# Patient Record
Sex: Female | Born: 1995 | Race: Black or African American | Hispanic: No | Marital: Single | Smoking: Former smoker
Health system: Southern US, Community
[De-identification: ages and names within clinical notes are randomized; demographics above are authoritative.]

---

## 2017-02-27 ENCOUNTER — Encounter (HOSPITAL_COMMUNITY): Payer: Self-pay

## 2017-02-27 ENCOUNTER — Emergency Department (HOSPITAL_COMMUNITY)
Admission: EM | Admit: 2017-02-27 | Discharge: 2017-02-27 | Disposition: A | Discharge status: Home / Self Care | Payer: Self-pay | Attending: Emergency Medicine | Admitting: Emergency Medicine

## 2017-02-27 DIAGNOSIS — J111 Influenza due to unidentified influenza virus with other respiratory manifestations: Secondary | ICD-10-CM | POA: Insufficient documentation

## 2017-02-27 DIAGNOSIS — R69 Illness, unspecified: Secondary | ICD-10-CM

## 2017-02-27 MED ORDER — KETOROLAC TROMETHAMINE 60 MG/2ML IM SOLN
60.0000 mg | Freq: Once | INTRAMUSCULAR | Status: AC
  Administered 2017-02-27: 60 mg via INTRAMUSCULAR
  Filled 2017-02-27: qty 2

## 2017-02-27 NOTE — ED Triage Notes (Signed)
Patient complains of cold symptoms, fever, body aches and headache since last Wednesday. On arrival alert and oriented, NAD

## 2017-02-27 NOTE — ED Notes (Signed)
Pt presents with HA and generalized pain.

## 2017-02-27 NOTE — ED Provider Notes (Signed)
[facility]EMER[facility]ENCY DEPT Provider Note   CSN: [id number] Arrival date & time: 02/27/17  1813  By signing my name below, I, Phyllis Petersen, attest that this documentation has been prepared under the direction and in the presence of Phyllis BuccoMelanie Yurani Fettes, MD. Electronically Signed: Doreatha MartinEva Petersen, ED Scribe. 02/27/17. 7:41 PM.     History   Chief Complaint No chief complaint on file.   HPI Phyllis Petersen is a 21 y.o. female who presents to the Emergency Department complaining of moderate generalized body aches onset 4 days ago with associated cough, rhinorrhea, congestion, subjective fever, nausea, vomiting. Pt also complains of a 10/10, constant, throbbing posterior HA that is unrelieved with Tylenol. She denies recent fall or head injury. No h/o migraines. Pt states she is tolerating fluids without difficulty but vomits after eating. No worsening factors noted. She denies diarrhea, neck pain, back pain. LMP now  The history is provided by the patient. No language interpreter was used.    History reviewed. No pertinent past medical history.  There are no active problems to display for this patient.   History reviewed. No pertinent surgical history.  OB History    No data available       Home Medications    Prior to Admission medications   Not on File    Family History No family history on file.  Social History Social History  Substance Use Topics  . Smoking status: Never Smoker  . Smokeless tobacco: Never Used  . Alcohol use Not on file     Allergies   Patient has no known allergies.   Review of Systems Review of Systems  Constitutional: Positive for fever (subjective).  HENT: Positive for congestion and rhinorrhea.   Eyes: Negative for photophobia.  Respiratory: Positive for cough.   Gastrointestinal: Positive for nausea and vomiting. Negative for diarrhea.  Musculoskeletal: Positive for myalgias (generalized). Negative for back pain and neck pain.  Skin: Negative for  wound.  Neurological: Positive for headaches. Negative for weakness and numbness.  All other systems reviewed and are negative.    Physical Exam Updated Vital Signs BP 129/72 (BP Location: Right Arm)   Pulse 108   Temp 99.9 F (37.7 C) (Oral)   Resp 20   SpO2 100%   Physical Exam  Constitutional: She is oriented to person, place, and time. She appears well-developed and well-nourished.  HENT:  Head: Normocephalic and atraumatic.  Right Ear: External ear normal.  Left Ear: External ear normal.  Mouth/Throat: Oropharynx is clear and moist.  TMs normal bilaterally.   Eyes: Pupils are equal, round, and reactive to light.  No photophobia  Neck: Normal range of motion. Neck supple.  No meningismus.   Cardiovascular: Normal rate, regular rhythm and normal heart sounds.   Pulmonary/Chest: Effort normal and breath sounds normal. No respiratory distress. She has no wheezes. She has no rales. She exhibits no tenderness.  Abdominal: Soft. Bowel sounds are normal. There is no tenderness. There is no rebound and no guarding.  Musculoskeletal: Normal range of motion. She exhibits no edema.  Lymphadenopathy:    She has no cervical adenopathy.  Neurological: She is alert and oriented to person, place, and time.  Skin: Skin is warm and dry. No rash noted.  Psychiatric: She has a normal mood and affect.  Nursing note and vitals reviewed.    ED Treatments / Results   DIAGNOSTIC STUDIES: Oxygen Saturation is 100% on RA, normal by my interpretation.    COORDINATION OF CARE: 7:39 PM Discussed treatment  plan with pt at bedside which includes symptomatic therapy and pt agreed to plan.    Labs (all labs ordered are listed, but only abnormal results are displayed) Labs Reviewed - No data to display  EKG  EKG Interpretation None       Radiology No results found.  Procedures Procedures (including critical care time)  Medications Ordered in ED Medications  ketorolac (TORADOL)  injection 60 mg (60 mg Intramuscular Given 02/27/17 2024)     Initial Impression / Assessment and Plan / ED Course  I have reviewed the triage vital signs and the nursing notes.  Pertinent labs & imaging results that were available during my care of the patient were reviewed by me and considered in my medical decision making (see chart for details).     Patient is a well-appearing 21 year old with flulike symptoms. She has a headache associated with the symptoms but no meningismus or other suggestions of meningitis or subarachnoid hemorrhage. She is neurologically intact. She is nontoxic-appearing. She was discharged home in good condition. She was given dose of Toradol in the ED. Symptomatic care instructions were given. She is out of the window for Tamiflu.Return precautions were given.  Final Clinical Impressions(s) / ED Diagnoses   Final diagnoses:  Influenza-like illness    New Prescriptions New Prescriptions   No medications on file    I personally performed the services described in this documentation, which was scribed in my presence.  The recorded information has been reviewed and considered.    Phyllis Bucco, MD 02/27/17 2046

## 2017-03-06 ENCOUNTER — Emergency Department (HOSPITAL_COMMUNITY)
Admission: EM | Admit: 2017-03-06 | Discharge: 2017-03-06 | Disposition: A | Discharge status: Home / Self Care | Payer: Self-pay | Attending: Emergency Medicine | Admitting: Emergency Medicine

## 2017-03-06 ENCOUNTER — Encounter (HOSPITAL_COMMUNITY): Payer: Self-pay

## 2017-03-06 DIAGNOSIS — B309 Viral conjunctivitis, unspecified: Secondary | ICD-10-CM | POA: Insufficient documentation

## 2017-03-06 MED ORDER — FLUORESCEIN SODIUM 0.6 MG OP STRP
2.0000 | ORAL_STRIP | Freq: Once | OPHTHALMIC | Status: AC
  Administered 2017-03-06: 2 via OPHTHALMIC
  Filled 2017-03-06: qty 2

## 2017-03-06 MED ORDER — TETRACAINE HCL 0.5 % OP SOLN
2.0000 [drp] | Freq: Once | OPHTHALMIC | Status: AC
  Administered 2017-03-06: 2 [drp] via OPHTHALMIC
  Filled 2017-03-06: qty 2

## 2017-03-06 NOTE — ED Notes (Signed)
Declined W/C at D/C and was escorted to lobby by RN. 

## 2017-03-06 NOTE — ED Triage Notes (Signed)
Patient complains of bilateral eye redness x 4 days. Denies trauma, denies drainage. Bilateral eye burning when exposed to light. No blurred vision

## 2017-03-06 NOTE — ED Provider Notes (Signed)
[facility] Provider Note   CSN: [id number] Arrival date & time: 03/06/17  1443     History   Chief Complaint Chief Complaint  Patient presents with  . eye redness    HPI Phyllis Petersen is a 21 y.o. female.  Phyllis Petersen is a 21 y.o. female who presents to the ER with a chief complaint of eye redness.  It started 4 days ago, has been constant, is worsened with light, better with nothing and has been associated with feelings of dry scratchy eyes, and headache.  She reports last week she had "the flu" but reports no flu like symptoms today.  She denies any trauma to her eyes, says she has no discharge or drainage bilaterally. No history of anything similar.  Reports no contact or glasses use.  She has blurry vision bilaterally, and reports the light makes her eyes and head hurt.  She does not feel like her eyes are stuck shut when she wakes up in the morning.  No rashes on her face. She reports no other symptoms.  No fevers, ear pain, nausea, vomiting, dizziness, stiff neck, abd pain.  She has decreased appetite since Monday, before ocular symptoms started but said she has not eaten normal since she was diagnosed with the flu.      History reviewed. No pertinent past medical history.  There are no active problems to display for this patient.   History reviewed. No pertinent surgical history.  OB History    No data available       Home Medications    Prior to Admission medications   Not on File    Family History No family history on file.  Social History Social History  Substance Use Topics  . Smoking status: Never Smoker  . Smokeless tobacco: Never Used  . Alcohol use Not on file     Allergies   Patient has no known allergies.   Review of Systems Review of Systems  Constitutional: Positive for appetite change. Negative for activity change, fatigue and fever.  HENT: Negative for congestion, dental problem, ear pain, facial swelling, sneezing,  sore throat, tinnitus and trouble swallowing.   Eyes: Positive for photophobia, pain, redness, itching and visual disturbance. Negative for discharge.  Respiratory: Negative for cough and shortness of breath.   Cardiovascular: Negative for chest pain.  Gastrointestinal: Negative for abdominal pain, diarrhea, nausea and vomiting.  Skin: Negative for rash and wound.  Neurological: Positive for headaches. Negative for dizziness, facial asymmetry, speech difficulty, weakness and light-headedness.  Hematological: Negative for adenopathy.     Physical Exam Updated Vital Signs BP 117/66 (BP Location: Left Arm)   Pulse 92   Temp 98.3 F (36.8 C) (Oral)   Resp 20   SpO2 100%   Physical Exam  Constitutional: She is oriented to person, place, and time. She appears well-developed and well-nourished. No distress.  HENT:  Head: Normocephalic and atraumatic.  Eyes: EOM are normal. Pupils are equal, round, and reactive to light. Right eye exhibits chemosis. Right eye exhibits no discharge, no exudate and no hordeolum. No foreign body present in the right eye. Left eye exhibits chemosis. Left eye exhibits no discharge, no exudate and no hordeolum. No foreign body present in the left eye. Right conjunctiva is injected. Right conjunctiva has no hemorrhage. Left conjunctiva is injected. Left conjunctiva has no hemorrhage. No scleral icterus.    Woods lamp exam performed.  No abrasions, or corneal defects noted.    Cardiovascular: Normal rate.  Pulmonary/Chest: Effort normal. No stridor.  Neurological: She is alert and oriented to person, place, and time. No cranial nerve deficit or sensory deficit. GCS eye subscore is 4. GCS verbal subscore is 5. GCS motor subscore is 6.  Skin: Skin is warm and dry. No rash noted. She is not diaphoretic.  Nursing note and vitals reviewed.    ED Treatments / Results  Labs (all labs ordered are listed, but only abnormal results are displayed) Labs Reviewed - No  data to display  EKG  EKG Interpretation None       Radiology No results found.  Procedures Procedures (including critical care time)  Medications Ordered in ED Medications  tetracaine (PONTOCAINE) 0.5 % ophthalmic solution 2 drop (2 drops Both Eyes Given 03/06/17 1647)  fluorescein ophthalmic strip 2 strip (2 strips Both Eyes Given 03/06/17 1647)     Initial Impression / Assessment and Plan / ED Course  I have reviewed the triage vital signs and the nursing notes.  Pertinent labs & imaging results that were available during my care of the patient were reviewed by me and considered in my medical decision making (see chart for details).    Viral conjunctivitis  Patient presentation consistent with viral conjunctivitis.  No purulent discharge, corneal abrasions, entrapment, consensual photophobia, or dendritic staining with fluorescein study.  Presentation non-concerning for iritis, bacterial conjunctivitis, corneal abrasions, or HSV.  No antibiotics are indicated and patient has been using Visine for itching.  Personal hygiene and frequent handwashing discussed.  Patient advised to followup with ophthalmologist if symptoms persist or worsen in any way including vision change or purulent discharge.  Patient verbalizes understanding and is agreeable with discharge.  I reviewed all results and findings with the patient and she is agreeable with the plan, diagnosis and discharge.  Final Clinical Impressions(s) / ED Diagnoses   Final diagnoses:  Viral conjunctivitis of both eyes    New Prescriptions New Prescriptions   No medications on file   Cristina GongElizabeth [name] PA-C 5:15 PM    Earnstine RegalElizabeth W [place], [place] 03/06/17 1716    Arby BarretteMarcy Pfeiffer, MD 03/06/17 [id number]

## 2017-09-23 ENCOUNTER — Encounter (HOSPITAL_COMMUNITY): Payer: Self-pay | Admitting: *Deleted

## 2017-09-23 ENCOUNTER — Emergency Department (HOSPITAL_COMMUNITY)
Admission: EM | Admit: 2017-09-23 | Discharge: 2017-09-24 | Disposition: A | Discharge status: Home / Self Care | Payer: Self-pay | Attending: Emergency Medicine | Admitting: Emergency Medicine

## 2017-09-23 DIAGNOSIS — R05 Cough: Secondary | ICD-10-CM

## 2017-09-23 DIAGNOSIS — J40 Bronchitis, not specified as acute or chronic: Secondary | ICD-10-CM

## 2017-09-23 DIAGNOSIS — R059 Cough, unspecified: Secondary | ICD-10-CM

## 2017-09-23 NOTE — ED Triage Notes (Signed)
The pt reports a cold and a cough for 3 days  No temp today she reports vlood tinged sputum

## 2017-09-24 ENCOUNTER — Emergency Department (HOSPITAL_COMMUNITY): Payer: Self-pay

## 2017-09-24 MED ORDER — ALBUTEROL SULFATE HFA 108 (90 BASE) MCG/ACT IN AERS
2.0000 | INHALATION_SPRAY | Freq: Once | RESPIRATORY_TRACT | Status: AC
Start: 2017-09-24 — End: 2017-09-24
  Administered 2017-09-24: 2 via RESPIRATORY_TRACT
  Filled 2017-09-24: qty 6.7

## 2017-09-24 MED ORDER — PREDNISONE 50 MG PO TABS: 50.0000 mg | ORAL_TABLET | Freq: Every day | ORAL | 0 refills | Status: DC

## 2017-09-24 MED ORDER — BENZONATATE 100 MG PO CAPS: 100.0000 mg | ORAL_CAPSULE | Freq: Three times a day (TID) | ORAL | 0 refills | Status: DC

## 2017-09-24 NOTE — Discharge Instructions (Signed)
Medications: Albuterol inhaler, Tessalon, prednisone  Treatment: Use albuterol inhaler every 4-6 hours as needed for shortness of breath, cough, wheezing, chest tightness. Take Tessalon every 8 hours as needed for cough. Take prednisone every day for the next 5 days.  Follow-up: Please return to the emergency department if you develop any shortness of breath, fever over 100.4, chest pain, increase in the amount of blood your coughing up, or any other new or concerning symptoms.

## 2017-09-24 NOTE — ED Provider Notes (Signed)
[facility] Provider Note   CSN: [id number] Arrival date & time: 09/23/17  2308     History   Chief Complaint Chief Complaint  Patient presents with  . Cough    HPI Phyllis Petersen is a 21 y.o. female Who was previously healthy who presents with a three-day history of cough and blood-tinged sputum. Patient has had associated chest tightness, worse with lying down at night. She reports she had a week of cough symptoms that turned into worsening symptoms 3 days ago. She denies any fevers. She also denies any shortness of breath. She has had some associated nausea with certain smells of food. Patient has taken Tylenol, Alka-Seltzer, NyQuil, DayQuil, Mucinex without relief of symptoms. She has had some associated sore throat, but no ear pain. She denies any history of blood clots, new leg pain or swelling, recent trips or surgeries, cancer, exogenous estrogen use.  HPI  History reviewed. No pertinent past medical history.  There are no active problems to display for this patient.   History reviewed. No pertinent surgical history.  OB History    No data available       Home Medications    Prior to Admission medications   Medication Sig Start Date End Date Taking? Authorizing Provider  benzonatate (TESSALON) 100 MG capsule Take 1 capsule (100 mg total) by mouth every 8 (eight) hours. 09/24/17   Gareld Obrecht, Waylan Boga, PA-C  predniSONE (DELTASONE) 50 MG tablet Take 1 tablet (50 mg total) by mouth daily with breakfast. 09/24/17   Emi Holes, PA-C    Family History No family history on file.  Social History Social History  Substance Use Topics  . Smoking status: Never Smoker  . Smokeless tobacco: Never Used  . Alcohol use No     Allergies   Patient has no known allergies.   Review of Systems Review of Systems  Constitutional: Negative for chills and fever.  HENT: Positive for congestion and sore throat. Negative for ear pain and facial swelling.     Respiratory: Positive for cough and chest tightness. Negative for shortness of breath.   Cardiovascular: Negative for chest pain.  Gastrointestinal: Positive for nausea. Negative for abdominal pain and vomiting.  Genitourinary: Negative for dysuria.  Musculoskeletal: Negative for back pain.  Skin: Negative for rash and wound.  Neurological: Negative for headaches.  Psychiatric/Behavioral: The patient is not nervous/anxious.      Physical Exam Updated Vital Signs BP 129/72 (BP Location: Left Arm)   Pulse 89   Temp 97.8 F (36.6 C) (Oral)   Resp 18   Ht  (1.549 m)   Wt 95.9 kg (211 lb 7 oz)   LMP 09/09/2017   SpO2 99%   BMI 39.95 kg/m   Physical Exam  Constitutional: She appears well-developed and well-nourished. No distress.  HENT:  Head: Normocephalic and atraumatic.  Mouth/Throat: Oropharynx is clear and moist. No oropharyngeal exudate, posterior oropharyngeal edema, posterior oropharyngeal erythema or tonsillar abscesses.  Eyes: Pupils are equal, round, and reactive to light. Conjunctivae are normal. Right eye exhibits no discharge. Left eye exhibits no discharge. No scleral icterus.  Neck: Normal range of motion. Neck supple. No thyromegaly present.  Cardiovascular: Normal rate, regular rhythm, normal heart sounds and intact distal pulses.  Exam reveals no gallop and no friction rub.   No murmur heard. Pulmonary/Chest: Effort normal and breath sounds normal. No stridor. No respiratory distress. She has no decreased breath sounds. She has no wheezes. She has no rales.  Abdominal:  Soft. Bowel sounds are normal. She exhibits no distension. There is no tenderness. There is no rebound and no guarding.  Musculoskeletal: She exhibits no edema.  Lymphadenopathy:    She has no cervical adenopathy.  Neurological: She is alert. Coordination normal.  Skin: Skin is warm and dry. No rash noted. She is not diaphoretic. No pallor.  Psychiatric: She has a normal mood and affect.   Nursing note and vitals reviewed.    ED Treatments / Results  Labs (all labs ordered are listed, but only abnormal results are displayed) Labs Reviewed - No data to display  EKG  EKG Interpretation None       Radiology Dg Chest 2 View  Result Date: 09/24/2017 CLINICAL DATA:  Cold symptoms for 1 week, now coughing up blood tinged sputum and chest tightness. EXAM: CHEST  2 VIEW COMPARISON:  None. FINDINGS: The cardiomediastinal contours are normal. The lungs are clear. Pulmonary vasculature is normal. No consolidation, pleural effusion, or pneumothorax. No acute osseous abnormalities are seen. IMPRESSION: No acute pulmonary process. Electronically Signed   By: Rubye Oaks M.D.   On: 09/24/2017 00:25    Procedures Procedures (including critical care time)  Medications Ordered in ED Medications  albuterol (PROVENTIL HFA;VENTOLIN HFA) 108 (90 Base) MCG/ACT inhaler 2 puff (not administered)     Initial Impression / Assessment and Plan / ED Course  I have reviewed the triage vital signs and the nursing notes.  Pertinent labs & imaging results that were available during my care of the patient were reviewed by me and considered in my medical decision making (see chart for details).     Pt symptoms consistent with URI with probable bronchitis. PERC negative. No chest pain or shortness of breath. CXR negative for acute infiltrate. Pt will be discharged with symptomatic treatment, Including albuterol inhaler, prednisone, Tessalon.  Discussed return precautions.  Patient understands and agrees with plan. Patient vitals stable throughout ED course and discharged in satisfactory condition. I discussed patient case with Dr. Blinda Leatherwood who guided the patient's management and agrees with plan.   Final Clinical Impressions(s) / ED Diagnoses   Final diagnoses:  Cough  Bronchitis    New Prescriptions New Prescriptions   BENZONATATE (TESSALON) 100 MG CAPSULE    Take 1 capsule (100 mg  total) by mouth every 8 (eight) hours.   PREDNISONE (DELTASONE) 50 MG TABLET    Take 1 tablet (50 mg total) by mouth daily with breakfast.     Emi Holes, PA-C 09/24/17 0037    Gilda Crease, MD 09/24/17 [phone]

## 2017-09-24 NOTE — ED Notes (Signed)
Patient Alert and oriented X4. Stable and ambulatory. Patient verbalized understanding of the discharge instructions.  Patient belongings were taken by the patient.  

## 2018-02-17 ENCOUNTER — Encounter (HOSPITAL_COMMUNITY): Payer: Self-pay | Admitting: Emergency Medicine

## 2018-02-17 ENCOUNTER — Other Ambulatory Visit: Payer: Self-pay

## 2018-02-17 ENCOUNTER — Emergency Department (HOSPITAL_COMMUNITY): Payer: Self-pay

## 2018-02-17 ENCOUNTER — Emergency Department (HOSPITAL_COMMUNITY)
Admission: EM | Admit: 2018-02-17 | Discharge: 2018-02-17 | Disposition: A | Discharge status: Home / Self Care | Payer: Self-pay | Attending: Emergency Medicine | Admitting: Emergency Medicine

## 2018-02-17 DIAGNOSIS — J111 Influenza due to unidentified influenza virus with other respiratory manifestations: Secondary | ICD-10-CM | POA: Insufficient documentation

## 2018-02-17 DIAGNOSIS — R69 Illness, unspecified: Secondary | ICD-10-CM

## 2018-02-17 DIAGNOSIS — Z79899 Other long term (current) drug therapy: Secondary | ICD-10-CM | POA: Insufficient documentation

## 2018-02-17 MED ORDER — BENZONATATE 100 MG PO CAPS: 100.0000 mg | ORAL_CAPSULE | Freq: Three times a day (TID) | ORAL | 0 refills | Status: DC

## 2018-02-17 NOTE — ED Triage Notes (Signed)
Patient with cough, URI for the last few days.  Patient states that when she coughs she is coughing up some blood.  Patient states that when she coughs she is having some chest pains and shortness of breath.

## 2018-02-17 NOTE — Discharge Instructions (Signed)
1. Medications: tessalon, usual home medications 2. Treatment: rest, drink plenty of fluids,  3. Follow Up: Please followup with your primary doctor in 3-5 days for discussion of your diagnoses and further evaluation after today's visit; if you do not have a primary care doctor use the resource guide provided to find one; Please return to the ER for high fevers, difficulty breathing or worsening symptoms

## 2018-02-17 NOTE — ED Provider Notes (Signed)
[facility] EMERGENCY DEPARTMENT Provider Note   CSN: [id number] Arrival date & time: 02/17/18  0030     History   Chief Complaint Chief Complaint  Patient presents with  . URI  . Hemoptysis    HPI Phyllis Petersen is a 22 y.o. female with no major medical history presents to the Emergency Department complaining of gradual, persistent, progressively worsening URI symptoms onset 4 days ago.  Patient reports associated nasal congestion, postnasal drip, sore throat, laryngitis, cough productive of clear sputum, subjective fevers.  Patient does report that occasionally after coughing fit she notices several streaks of blood in her sputum.  She denies shortness of breath, history of asthma, history of DVT, leg swelling, estrogen usage.  She denies coughing up blood clots.  She denies recent international travel.  Patient was recently exposed to someone with influenza A.  No treatments prior to arrival.  No aggravating or alleviating factors.   The history is provided by the patient and medical records. No language interpreter was used.    History reviewed. No pertinent past medical history.  There are no active problems to display for this patient.   History reviewed. No pertinent surgical history.  OB History    No data available       Home Medications    Prior to Admission medications   Medication Sig Start Date End Date Taking? Authorizing Provider  benzonatate (TESSALON) 100 MG capsule Take 1 capsule (100 mg total) by mouth every 8 (eight) hours. 02/17/18   Janaysha Depaulo, Dahlia Client, PA-C  predniSONE (DELTASONE) 50 MG tablet Take 1 tablet (50 mg total) by mouth daily with breakfast. 09/24/17   Emi Holes, PA-C    Family History No family history on file.  Social History Social History   Tobacco Use  . Smoking status: Never Smoker  . Smokeless tobacco: Never Used  Substance Use Topics  . Alcohol use: No  . Drug use: Not on file     Allergies     Patient has no known allergies.   Review of Systems Review of Systems  Constitutional: Positive for fatigue and fever. Negative for appetite change, diaphoresis and unexpected weight change.  HENT: Positive for congestion, postnasal drip, rhinorrhea, sinus pressure, sore throat and voice change. Negative for mouth sores and sinus pain.   Eyes: Negative for visual disturbance.  Respiratory: Positive for cough. Negative for chest tightness, shortness of breath and wheezing.   Cardiovascular: Negative for chest pain.  Gastrointestinal: Positive for nausea. Negative for abdominal pain, constipation, diarrhea and vomiting.  Endocrine: Negative for polydipsia, polyphagia and polyuria.  Genitourinary: Negative for dysuria, frequency, hematuria and urgency.  Musculoskeletal: Positive for myalgias. Negative for back pain and neck stiffness.  Skin: Negative for rash.  Allergic/Immunologic: Negative for immunocompromised state.  Neurological: Negative for syncope, light-headedness and headaches.  Hematological: Does not bruise/bleed easily.  Psychiatric/Behavioral: Negative for sleep disturbance. The patient is not nervous/anxious.      Physical Exam Updated Vital Signs BP 118/80 (BP Location: Right Arm)   Pulse 75   Temp 98.3 F (36.8 C) (Oral)   Resp 16   Ht 5' (1.524 m)   Wt 93 kg (205 lb)   SpO2 100%   BMI 40.04 kg/m   Physical Exam  Constitutional: She appears well-developed and well-nourished. No distress.  HENT:  Head: Normocephalic and atraumatic.  Right Ear: Tympanic membrane, external ear and ear canal normal.  Left Ear: Tympanic membrane, external ear and ear canal normal.  Nose:  Mucosal edema and rhinorrhea present. No epistaxis. Right sinus exhibits no maxillary sinus tenderness and no frontal sinus tenderness. Left sinus exhibits no maxillary sinus tenderness and no frontal sinus tenderness.  Mouth/Throat: Uvula is midline and mucous membranes are normal. Mucous  membranes are not pale and not cyanotic. No oropharyngeal exudate, posterior oropharyngeal edema, posterior oropharyngeal erythema or tonsillar abscesses.  Eyes: Conjunctivae are normal. Pupils are equal, round, and reactive to light.  Neck: Normal range of motion and full passive range of motion without pain.  Cardiovascular: Normal rate and intact distal pulses.  Pulmonary/Chest: Effort normal and breath sounds normal. No stridor.  Clear and equal breath sounds without focal wheezes, rhonchi, rales  Abdominal: Soft. There is no tenderness.  Musculoskeletal: Normal range of motion.  Lymphadenopathy:    She has no cervical adenopathy.  Neurological: She is alert.  Skin: Skin is warm and dry. No rash noted. She is not diaphoretic.  Psychiatric: She has a normal mood and affect.  Nursing note and vitals reviewed.    ED Treatments / Results   Radiology Dg Chest 2 View  Result Date: 02/17/2018 CLINICAL DATA:  Cough, short of breath EXAM: CHEST  2 VIEW COMPARISON:  09/24/2017 FINDINGS: The heart size and mediastinal contours are within normal limits. Both lungs are clear. The visualized skeletal structures are unremarkable. IMPRESSION: No active cardiopulmonary disease. Electronically Signed   By: Jasmine PangKim  Fujinaga M.D.   On: 02/17/2018 01:26    Procedures Procedures (including critical care time)  Medications Ordered in ED Medications - No data to display   Initial Impression / Assessment and Plan / ED Course  I have reviewed the triage vital signs and the nursing notes.  Pertinent labs & imaging results that were available during my care of the patient were reviewed by me and considered in my medical decision making (see chart for details).     Patient with symptoms consistent with influenza.  Vitals are stable, low-grade fever.  No signs of dehydration, tolerating PO's.  Lungs are clear.  Chest x-ray ordered at triage without evidence of pneumonia.  I personally reviewed these  images.  The patient understands that symptoms are greater than the recommended 24-48 hour window of treatment.  Patient will be discharged with instructions to orally hydrate, rest, and use over-the-counter medications such as anti-inflammatories ibuprofen and Aleve for muscle aches and Tylenol for fever.  Patient will also be given a cough suppressant.    Final Clinical Impressions(s) / ED Diagnoses   Final diagnoses:  Influenza-like illness    ED Discharge Orders        Ordered    benzonatate (TESSALON) 100 MG capsule  Every 8 hours     02/17/18 0430       Doninique Lwin, Boyd KerbsHannah, PA-C 02/17/18 0439    Azalia Bilisampos, Kevin, MD 02/17/18 [phone]

## 2018-07-05 ENCOUNTER — Encounter (HOSPITAL_COMMUNITY): Payer: Self-pay

## 2018-07-05 ENCOUNTER — Emergency Department (HOSPITAL_COMMUNITY)
Admission: EM | Admit: 2018-07-05 | Discharge: 2018-07-05 | Disposition: A | Discharge status: Home / Self Care | Payer: No Typology Code available for payment source | Attending: Emergency Medicine | Admitting: Emergency Medicine

## 2018-07-05 ENCOUNTER — Emergency Department (HOSPITAL_COMMUNITY): Payer: No Typology Code available for payment source

## 2018-07-05 ENCOUNTER — Other Ambulatory Visit: Payer: Self-pay

## 2018-07-05 DIAGNOSIS — S39012A Strain of muscle, fascia and tendon of lower back, initial encounter: Secondary | ICD-10-CM | POA: Diagnosis present

## 2018-07-05 DIAGNOSIS — Y939 Activity, unspecified: Secondary | ICD-10-CM | POA: Diagnosis not present

## 2018-07-05 DIAGNOSIS — Y999 Unspecified external cause status: Secondary | ICD-10-CM | POA: Insufficient documentation

## 2018-07-05 DIAGNOSIS — Y929 Unspecified place or not applicable: Secondary | ICD-10-CM | POA: Insufficient documentation

## 2018-07-05 DIAGNOSIS — R109 Unspecified abdominal pain: Secondary | ICD-10-CM

## 2018-07-05 LAB — BASIC METABOLIC PANEL
Anion gap: 6 (ref 5–15)
BUN: 11 mg/dL (ref 6–20)
CO2: 27 mmol/L (ref 22–32)
CREATININE: 0.81 mg/dL (ref 0.44–1.00)
Calcium: 9.3 mg/dL (ref 8.9–10.3)
Chloride: 107 mmol/L (ref 98–111)
GFR calc Af Amer: >60 mL/min (ref >60)
GLUCOSE: 99 mg/dL (ref 70–99)
POTASSIUM: 3.6 mmol/L (ref 3.5–5.1)
SODIUM: 140 mmol/L (ref 135–145)

## 2018-07-05 LAB — CBC WITH DIFFERENTIAL/PLATELET
Basophils Absolute: 0 10*3/uL (ref 0.0–0.1)
Basophils Relative: 0 %
Eosinophils Absolute: 0.1 10*3/uL (ref 0.0–0.7)
Eosinophils Relative: 1 %
HCT: 36.6 % (ref 36.0–46.0)
Hemoglobin: 11.5 g/dL — ABNORMAL LOW (ref 12.0–15.0)
LYMPHS ABS: 1.8 10*3/uL (ref 0.7–4.0)
Lymphocytes Relative: 31 %
MCH: 21.2 pg — ABNORMAL LOW (ref 26.0–34.0)
MCHC: 31.4 g/dL (ref 30.0–36.0)
MCV: 67.4 fL — AB (ref 78.0–100.0)
MONO ABS: 0.5 10*3/uL (ref 0.1–1.0)
MONOS PCT: 9 %
NEUTROS ABS: 3.5 10*3/uL (ref 1.7–7.7)
Neutrophils Relative %: 59 %
PLATELETS: 244 10*3/uL (ref 150–400)
RBC: 5.43 MIL/uL — ABNORMAL HIGH (ref 3.87–5.11)
RDW: 15.8 % — AB (ref 11.5–15.5)
WBC: 5.9 10*3/uL (ref 4.0–10.5)

## 2018-07-05 MED ORDER — CYCLOBENZAPRINE HCL 10 MG PO TABS
10.0000 mg | ORAL_TABLET | Freq: Once | ORAL | Status: AC
  Administered 2018-07-05: 10 mg via ORAL
  Filled 2018-07-05: qty 1

## 2018-07-05 MED ORDER — ORPHENADRINE CITRATE ER 100 MG PO TB12: 100.0000 mg | ORAL_TABLET | Freq: Two times a day (BID) | ORAL | 0 refills | Status: AC

## 2018-07-05 MED ORDER — MELOXICAM 7.5 MG PO TABS
7.5000 mg | ORAL_TABLET | Freq: Every day | ORAL | Status: AC
  Administered 2018-07-05: 7.5 mg via ORAL
  Filled 2018-07-05: qty 1

## 2018-07-05 MED ORDER — ORPHENADRINE CITRATE ER 100 MG PO TB12
100.0000 mg | ORAL_TABLET | Freq: Once | ORAL | Status: DC
  Filled 2018-07-05: qty 1

## 2018-07-05 MED ORDER — MELOXICAM 7.5 MG PO TABS: 7.5000 mg | ORAL_TABLET | Freq: Every day | ORAL | 0 refills | Status: AC
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
## 2018-07-05 NOTE — Discharge Instructions (Addendum)
Recheck with PCP, return to the emergency room for worsening or concerning symptoms.  Referrals have been given for a primary care provider should you need one. Take meloxicam and Norflex as needed as prescribed for sore muscles.  He may also apply warm compresses to the sore muscles for 20 minutes at a time. The CT scan of your abdomen and pelvis today is normal, there is no evidence of trauma in your abdomen, injury to your lower ribs or back.  Suspect you have strained the muscles in your back.
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
## 2018-07-05 NOTE — ED Triage Notes (Signed)
Pt has some rt eye swelling and is c/o HA and has light sensitivity to rt eye.

## 2018-07-11 ENCOUNTER — Emergency Department (HOSPITAL_COMMUNITY)
Admission: EM | Admit: 2018-07-11 | Discharge: 2018-07-11 | Disposition: A | Discharge status: Home / Self Care | Payer: No Typology Code available for payment source | Attending: Emergency Medicine | Admitting: Emergency Medicine

## 2018-07-11 ENCOUNTER — Encounter (HOSPITAL_COMMUNITY): Payer: Self-pay | Admitting: Emergency Medicine

## 2018-07-11 DIAGNOSIS — H209 Unspecified iridocyclitis: Secondary | ICD-10-CM | POA: Insufficient documentation

## 2018-07-11 DIAGNOSIS — H5711 Ocular pain, right eye: Secondary | ICD-10-CM

## 2018-07-11 DIAGNOSIS — Z79899 Other long term (current) drug therapy: Secondary | ICD-10-CM | POA: Insufficient documentation

## 2018-07-11 MED ORDER — FLUORESCEIN SODIUM 1 MG OP STRP
1.0000 | ORAL_STRIP | Freq: Once | OPHTHALMIC | Status: AC
  Administered 2018-07-11: 1 via OPHTHALMIC
  Filled 2018-07-11: qty 1

## 2018-07-11 MED ORDER — TETRACAINE HCL 0.5 % OP SOLN
1.0000 [drp] | Freq: Once | OPHTHALMIC | Status: AC
  Administered 2018-07-11: 1 [drp] via OPHTHALMIC
  Filled 2018-07-11: qty 4

## 2018-07-11 NOTE — Discharge Instructions (Signed)
As we discussed, Dr. Robin SearingMarchase will see you in his office tomorrow. His information is listed on the paperwork. Go to his office tomorrow for further evaluation.  Return to the Emergency Department for any worsening pain, fever, vision loss, or any other worsening or concerning symptoms.
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
## 2018-07-11 NOTE — ED Notes (Signed)
ED Provider at bedside. 
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
## 2018-10-06 IMAGING — DX DG CHEST 2V
2 series · 2 of 2 positions shown · non-contrast
Comparison: 09/24/2017

CLINICAL DATA: Cough, short of breath

EXAM:
CHEST  2 VIEW

[chest pa]
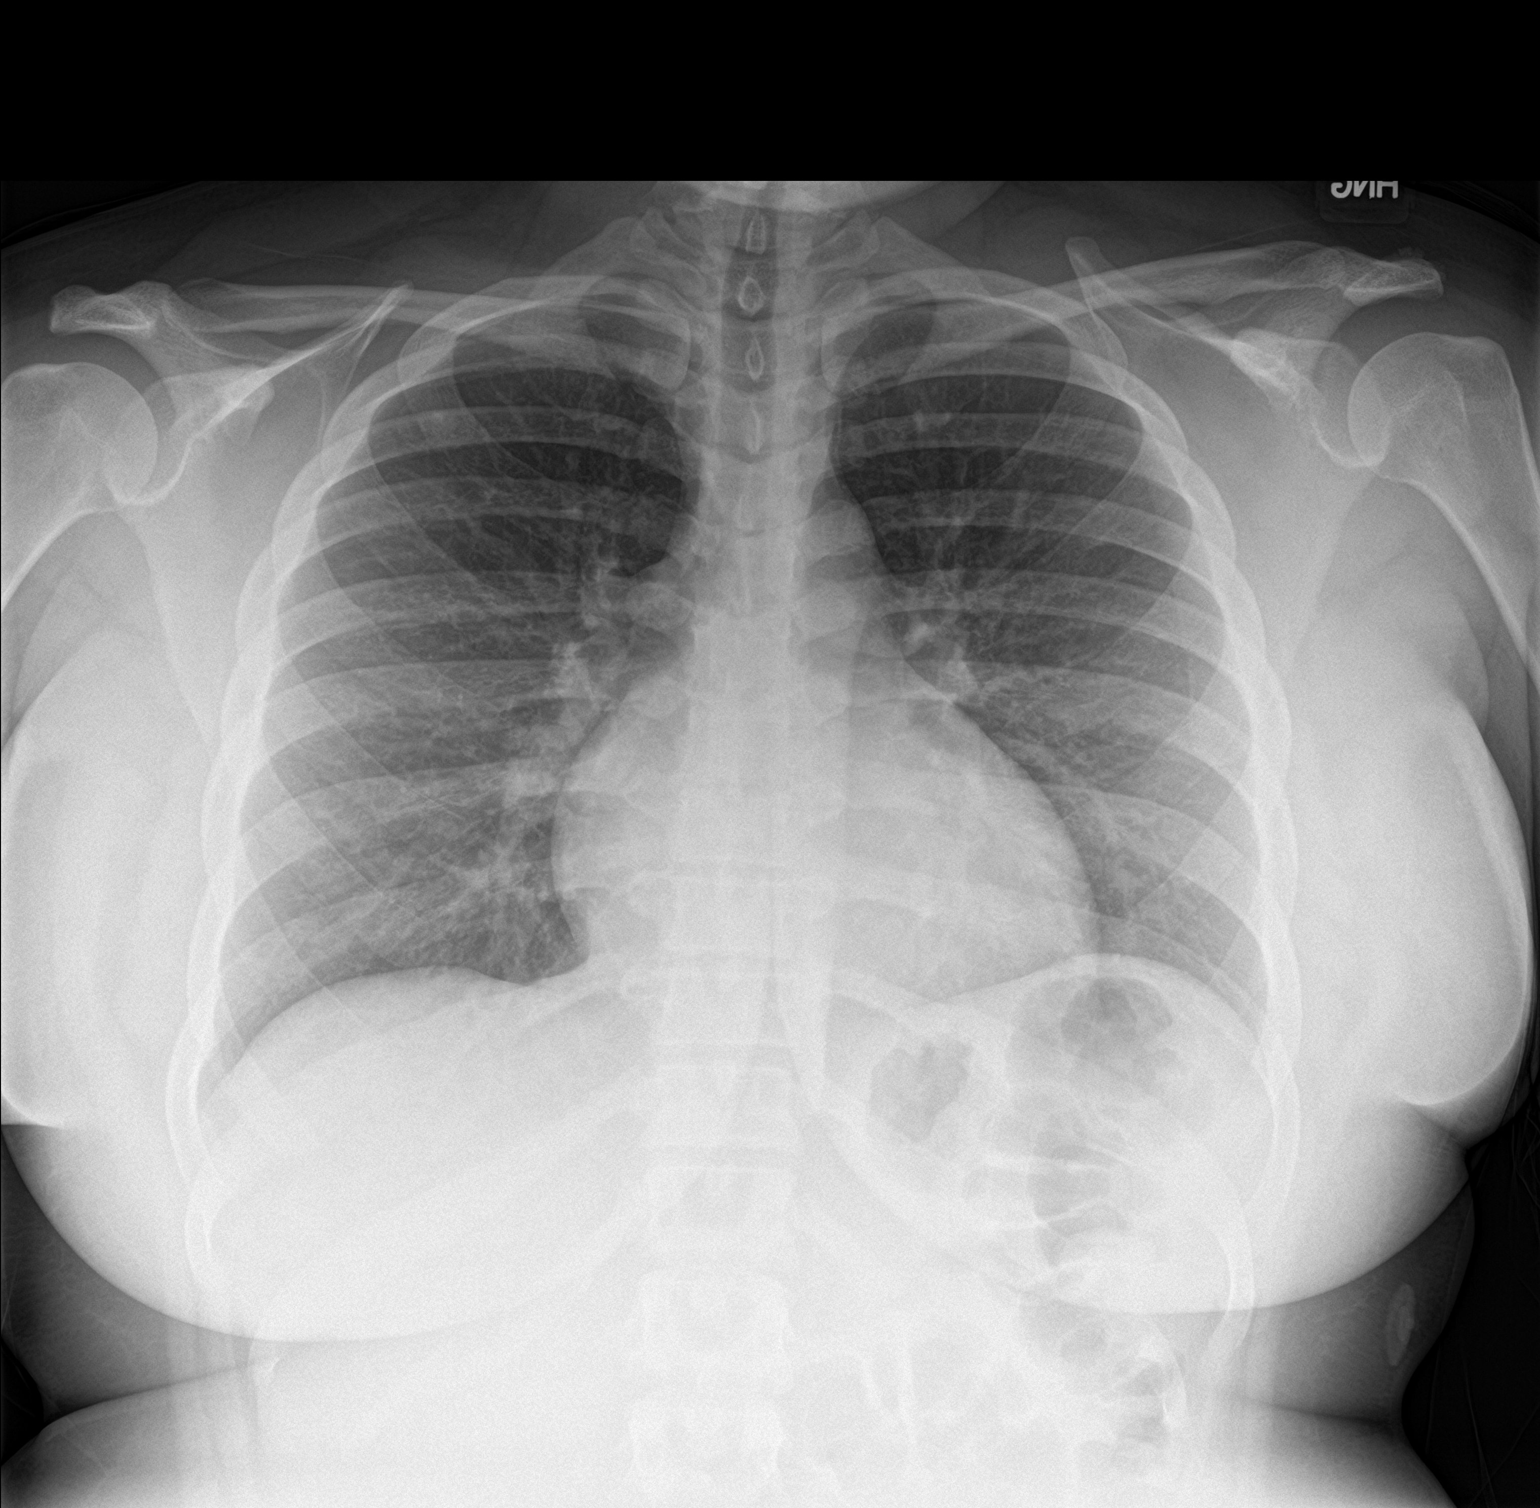

[chest lat]
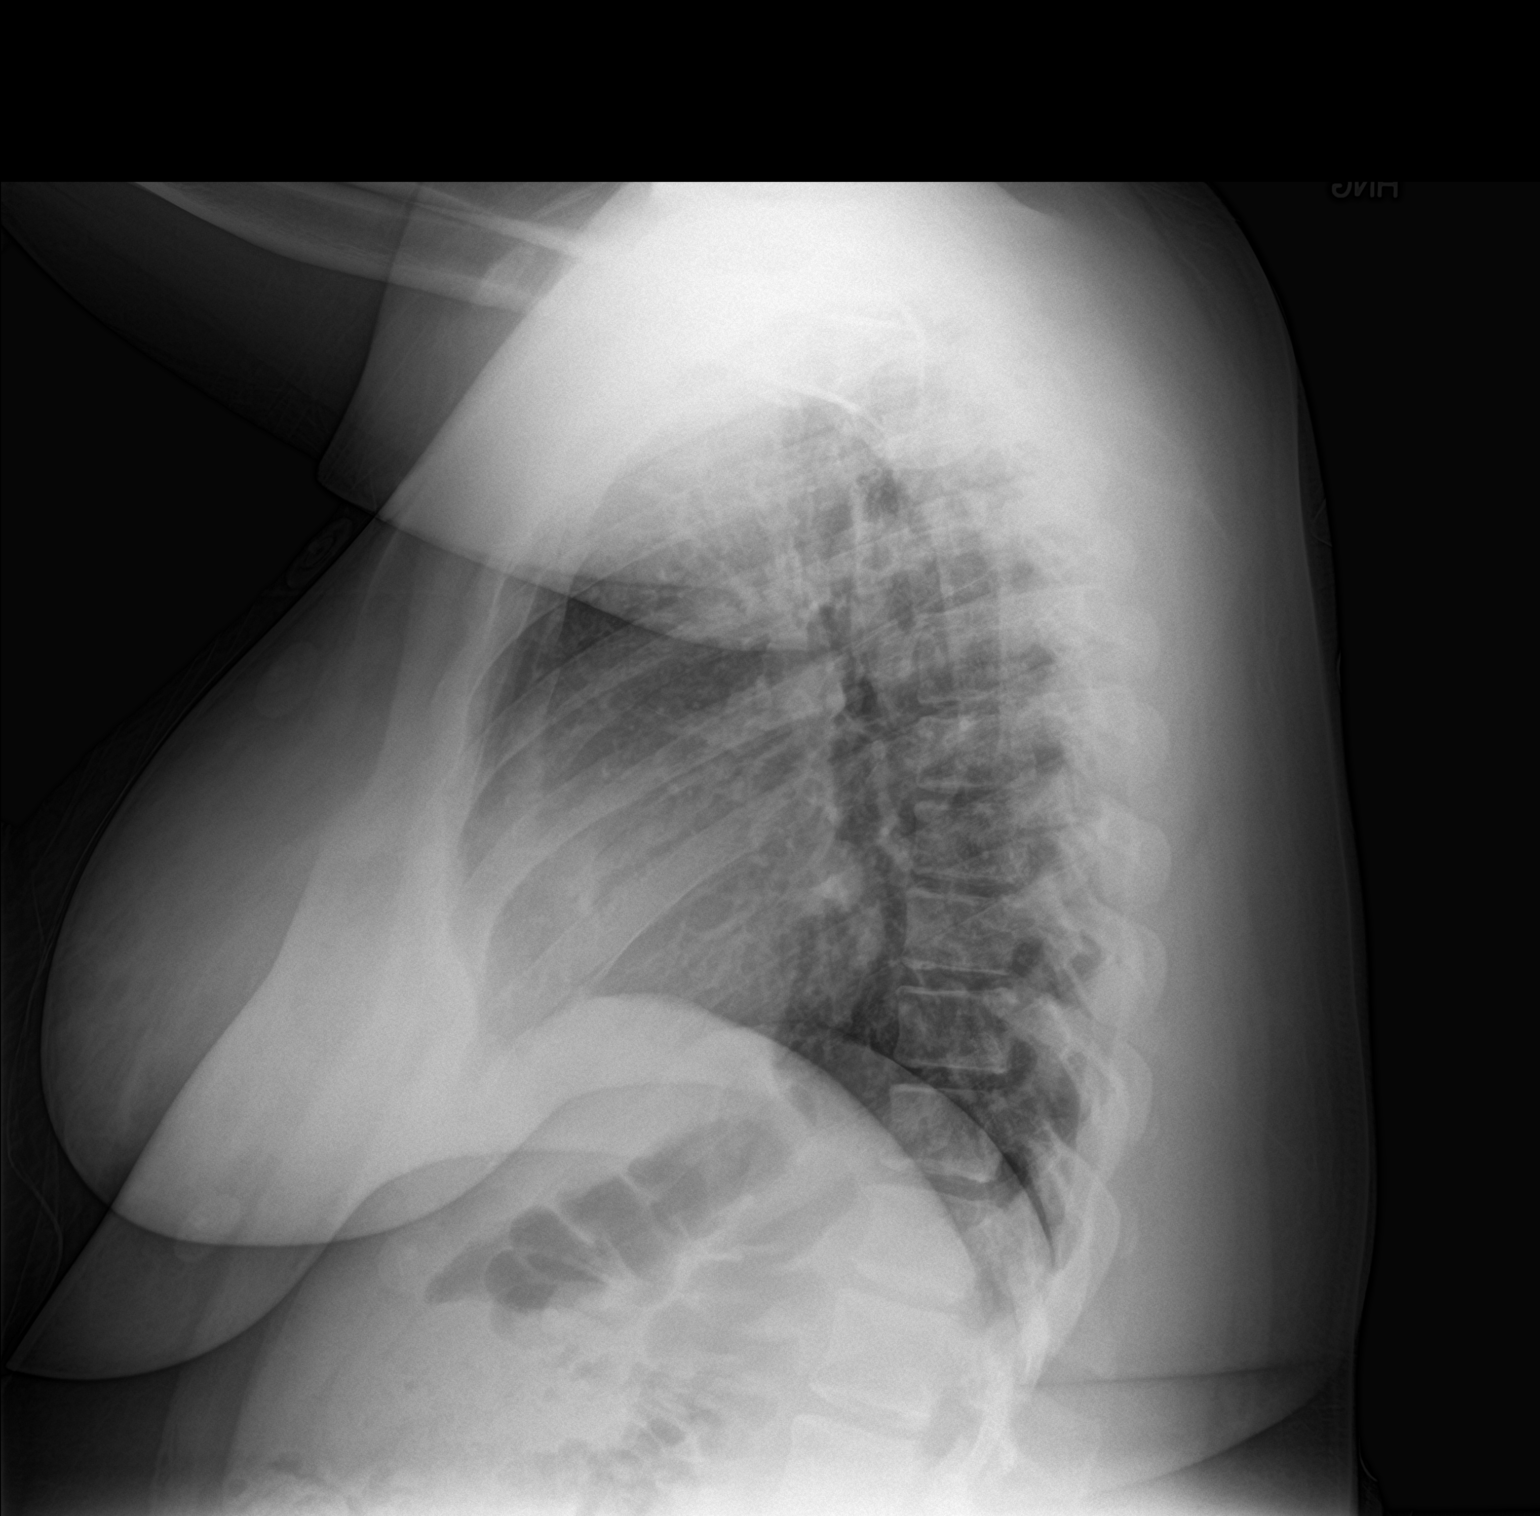

[2 of 2 positions shown; findings below may reference images not displayed]

FINDINGS: The heart size and mediastinal contours are within normal limits.
Both lungs are clear. The visualized skeletal structures are
unremarkable.
IMPRESSION: No active cardiopulmonary disease.

## 2019-01-29 ENCOUNTER — Emergency Department (HOSPITAL_COMMUNITY)
Admission: EM | Admit: 2019-01-29 | Discharge: 2019-01-29 | Disposition: A | Discharge status: Home / Self Care | Payer: Self-pay | Attending: Emergency Medicine | Admitting: Emergency Medicine

## 2019-01-29 ENCOUNTER — Encounter (HOSPITAL_COMMUNITY): Payer: Self-pay | Admitting: *Deleted

## 2019-01-29 DIAGNOSIS — R112 Nausea with vomiting, unspecified: Secondary | ICD-10-CM | POA: Insufficient documentation

## 2019-01-29 LAB — CBG MONITORING, ED: GLUCOSE-CAPILLARY: 83 mg/dL (ref 70–99)

## 2019-01-29 MED ORDER — ONDANSETRON 8 MG PO TBDP: 8.0000 mg | ORAL_TABLET | Freq: Three times a day (TID) | ORAL | 0 refills | Status: AC | PRN

## 2019-01-29 MED ORDER — ONDANSETRON 4 MG PO TBDP
8.0000 mg | ORAL_TABLET | Freq: Once | ORAL | Status: AC
  Administered 2019-01-29: 8 mg via ORAL
  Filled 2019-01-29: qty 2

## 2019-01-29 MED ORDER — OSELTAMIVIR PHOSPHATE 75 MG PO CAPS: 75.0000 mg | ORAL_CAPSULE | Freq: Two times a day (BID) | ORAL | 0 refills | Status: AC
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
## 2019-01-29 NOTE — ED Triage Notes (Signed)
Pt in c/o body aches and headache that started today, intermittent nausea, no distress noted

## 2019-01-29 NOTE — ED Provider Notes (Signed)
[facility]   CSN: [id number] Arrival date & time: 01/29/19  1250     History   Chief Complaint Chief Complaint  Patient presents with  . Generalized Body Aches  . Headache    HPI Phyllis Petersen is a 23 y.o. female.  HPI  23 year old female presents today complaining of muscle aches nausea vomiting x2.  She reports that she felt her normal self yesterday.  Today she has had diffuse muscle aches and has felt somewhat cold.  She vomited twice.  She has been taking sips of clear liquids.  She has not taken any medication.  She does not think that she is pregnant she is not sexually active with men and had her last normal menstrual period approximately 2 weeks ago.  She has been told in the past that she was anemic.  She reports no orthostatic symptoms.  She has somewhat heavy periods but has not noted any other recent changes.  History reviewed. No pertinent past medical history.  There are no active problems to display for this patient.   History reviewed. No pertinent surgical history.   OB History   No obstetric history on file.      Home Medications    Prior to Admission medications   Not on File    Family History History reviewed. No pertinent family history.  Social History Social History   Tobacco Use  . Smoking status: Never Smoker  . Smokeless tobacco: Never Used  Substance Use Topics  . Alcohol use: No  . Drug use: Never     Allergies   Patient has no known allergies.   Review of Systems Review of Systems  All other systems reviewed and are negative.    Physical Exam Updated Vital Signs BP 118/73 (BP Location: Right Arm)   Pulse (!) 104   Temp 98.5 F (36.9 C) (Oral)   Resp 18   SpO2 98%   Physical Exam Vitals signs and nursing note reviewed.  Constitutional:      General: She is not in acute distress.    Appearance: She is well-developed. She is obese. She is not ill-appearing  or toxic-appearing.  HENT:     Head: Normocephalic and atraumatic.     Mouth/Throat:     Mouth: Mucous membranes are moist.  Eyes:     Extraocular Movements: Extraocular movements intact.     Right eye: Normal extraocular motion.     Left eye: Normal extraocular motion.  Neck:     Musculoskeletal: Normal range of motion.  Cardiovascular:     Rate and Rhythm: Normal rate and regular rhythm.  Pulmonary:     Effort: Pulmonary effort is normal.     Breath sounds: Normal breath sounds.  Abdominal:     General: Bowel sounds are normal.     Palpations: Abdomen is soft.  Musculoskeletal: Normal range of motion.  Skin:    General: Skin is warm and dry.  Neurological:     Mental Status: She is alert.  Psychiatric:        Mood and Affect: Mood normal.      ED Treatments / Results  Labs (all labs ordered are listed, but only abnormal results are displayed) Labs Reviewed  CBG MONITORING, ED    EKG None  Radiology No results found.  Procedures Procedures (including critical care time)  Medications Ordered in ED Medications  ondansetron (ZOFRAN-ODT) disintegrating tablet 8 mg (has no administration in time range)  Initial Impression / Assessment and Plan / ED Course  I have reviewed the triage vital signs and the nursing notes.  Pertinent labs & imaging results that were available during my care of the patient were reviewed by me and considered in my medical decision making (see chart for details).   Patient given Zofran here in ED, vomiting.  Her abdomen is soft and I have a low index of suspicion for intra-abdominal problem.  Discussed return precautions.  She does request that her blood sugar be checked.  CBG is pending.   We have discussed that it is likely that this could be her first symptoms of influenza.  She is given a prescription for Tamiflu to begin if her symptoms become consistent with influenza with fever or chills  Final Clinical Impressions(s) / ED  Diagnoses   Final diagnoses:  Non-intractable vomiting with nausea, unspecified vomiting type    ED Discharge Orders    None       Margarita Grizzle, MD 01/29/19 1427
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
## 2020-04-09 ENCOUNTER — Emergency Department (HOSPITAL_COMMUNITY)
Admission: EM | Admit: 2020-04-09 | Discharge: 2020-04-09 | Disposition: A | Discharge status: Home / Self Care | Payer: Self-pay | Attending: Emergency Medicine | Admitting: Emergency Medicine

## 2020-04-09 ENCOUNTER — Encounter (HOSPITAL_COMMUNITY): Payer: Self-pay | Admitting: *Deleted

## 2020-04-09 ENCOUNTER — Other Ambulatory Visit: Payer: Self-pay

## 2020-04-09 DIAGNOSIS — F410 Panic disorder [episodic paroxysmal anxiety] without agoraphobia: Secondary | ICD-10-CM | POA: Insufficient documentation

## 2020-04-09 DIAGNOSIS — F419 Anxiety disorder, unspecified: Secondary | ICD-10-CM | POA: Insufficient documentation

## 2020-04-09 MED ORDER — HYDROXYZINE HCL 25 MG PO TABS: 25.0000 mg | ORAL_TABLET | Freq: Three times a day (TID) | ORAL | 0 refills | Status: AC | PRN

## 2020-04-09 NOTE — ED Provider Notes (Signed)
[facility]-EMERGENCY DEPT Provider Note   CSN: [id number] Arrival date & time: 04/09/20  0535     History Chief Complaint  Patient presents with  . Anxiety    Phyllis Petersen is a 24 y.o. female with a past medical history of panic attack and no other pertinent medical history that presents to the emergency department via EMS for panic attack.  EMS did not give any medications. She reports shortness of breath, palpitations, numbness and tingling in her extremities, dizziness that began early this morning.  She states that after 10 min she called EMS and her symptoms resolved in the waiting room after a little over an hour when she fell asleep.  Currently she denies any symptoms, however she is very sleepy.  She did just come off working night shifts and states that she got home at 3 AM.  Last night, she was about to fall asleep and was sitting and watching TV when she started experiencing the symptoms.  She denies any triggers. She states that during her previous panic attacks she has felt like this, but this one has been way worse.  She states that they normally resolve after 5 mins of her sitting still. This is her third one.  She denies any alcohol use, drug use.  She denies any chest pain, headache, recent long travel/surgeries, clotting disorders, OCP use, nausea, vomiting, headache.  She denies any current shortness of breath, chest pain, palpitations, dizziness, paresthesias.  She denies any SI/HI/hallucinations.   HPI     History reviewed. No pertinent past medical history.  There are no problems to display for this patient.   History reviewed. No pertinent surgical history.   OB History   No obstetric history on file.     No family history on file.  Social History   Tobacco Use  . Smoking status: Never Smoker  . Smokeless tobacco: Never Used  Substance Use Topics  . Alcohol use: No  . Drug use: Never    Home Medications Prior to Admission  medications   Medication Sig Start Date End Date Taking? Authorizing Provider  ondansetron (ZOFRAN ODT) 8 MG disintegrating tablet Take 1 tablet (8 mg total) by mouth every 8 (eight) hours as needed for nausea or vomiting. 01/29/19   Margarita Grizzle, MD  oseltamivir (TAMIFLU) 75 MG capsule Take 1 capsule (75 mg total) by mouth every 12 (twelve) hours. 01/29/19   Margarita Grizzle, MD    Allergies    Patient has no known allergies.  Review of Systems   Review of Systems  Respiratory: Positive for shortness of breath.   Cardiovascular: Positive for palpitations.  Neurological: Positive for dizziness.   Ten systems are reviewed and are negative for acute changes, except as noted in HPI.  Physical Exam Updated Vital Signs BP 109/70 (BP Location: Right Arm)   Pulse (!) 110   Temp 98.1 F (36.7 C) (Oral)   Resp 18   LMP 03/27/2020   SpO2 97%   Physical Exam   .PE: Constitutional: well-developed, well-nourished, no apparent distress, she does appear sleepy, but is easily arousable and will have a conversation HENT: normocephalic, atraumatic Cardiovascular: normal rate and rhythm, distal pulses intact Pulmonary/Chest: effort normal; breath sounds clear and equal bilaterally; no wheezes or rales Abdominal: soft and nontender Musculoskeletal: full ROM, no edema Lymphadenopathy: no cervical adenopathy Neurological: .Marland KitchenAlert and oriented. Clear speech.  EOMS intact, CNIII-XII grossly intact. Bilateral upper and lower extremities' sensation grossly intact. 5/5 symmetric strength upper and  lower extremity.  Normal coordination.  Skin: warm and dry, no rash, no diaphoresis Psychiatric: normal mood and affect, normal behavior   ED Results / Procedures / Treatments   Labs (all labs ordered are listed, but only abnormal results are displayed) Labs Reviewed - No data to display  EKG None  Radiology No results found.  Procedures Procedures (including critical care time)  Medications Ordered  in ED Medications - No data to display  ED Course  I have reviewed the triage vital signs and the nursing notes.  Pertinent labs & imaging results that were available during my care of the patient were reviewed by me and considered in my medical decision making (see chart for details).    MDM Rules/Calculators/A&P                      Phyllis Petersen is a 24 y.o. female with a past medical history of panic attack and no other pertinent medical history that presents to the emergency department for panic attack. Pt expresses that she has had these episodes in the past, however this one has been the worst.  Patient is not currently experiencing any symptoms. Normal neuro exam. She did not get any medications, panic attack went away on its own over time. Pt to be discharged, she is agreeable and wants to go home. Pt seen with PA-C Alecia Lemming who agrees with plan.   Explained ways to reduce anxiety. Gave her handouts. Pt to follow up with PCP in a couple days, provided resources. Will discharge on Atarax. Explained to pt s/e. Return ER precautions discussed with patient.  Final Clinical Impression(s) / ED Diagnoses Final diagnoses:  None    Rx / DC Orders ED Discharge Orders    None       Alfredia Client, PA-C 04/09/20 1443    Carmin Muskrat, MD 04/09/20 [phone]

## 2020-04-09 NOTE — ED Triage Notes (Signed)
Pt reports anxiety, denies SI or HI. Pt falling asleep during triage, arousable to voice. Denies that she has taken any medications this morning.

## 2020-04-09 NOTE — ED Triage Notes (Signed)
Pt arrives via [facility] with c/o anxiety/ panic attack, last one about a year ago. She was watching TV, and she was hyperventilating and felt like her heart was beating fast. 152/100, hr 108-112, resp 26-28, 94-96% RA, temp 98.1

## 2020-04-09 NOTE — Discharge Instructions (Signed)
You were seen today for anxiety attack. Look over attachments on managing anxiety. Take Hydroxyzine as needed for anxiety as prescribed. Follow up with Phyllis Petersen for anxiety. Come back to the ER for any reactions to the medication, shortness of breath, chest pain, or feeling like you cant not breath.

## 2024-04-25 ENCOUNTER — Emergency Department (HOSPITAL_COMMUNITY)
Admission: EM | Admit: 2024-04-25 | Discharge: 2024-04-25 | Disposition: A | Discharge status: Home / Self Care | Payer: Self-pay | Attending: Emergency Medicine | Admitting: Emergency Medicine

## 2024-04-25 ENCOUNTER — Emergency Department (HOSPITAL_COMMUNITY): Payer: Self-pay

## 2024-04-25 ENCOUNTER — Encounter (HOSPITAL_COMMUNITY): Payer: Self-pay

## 2024-04-25 DIAGNOSIS — R509 Fever, unspecified: Secondary | ICD-10-CM | POA: Insufficient documentation

## 2024-04-25 DIAGNOSIS — J020 Streptococcal pharyngitis: Secondary | ICD-10-CM | POA: Insufficient documentation

## 2024-04-25 LAB — RESP PANEL BY RT-PCR (RSV, FLU A&B, COVID)  RVPGX2
Influenza A by PCR: NEGATIVE
Influenza B by PCR: NEGATIVE
Resp Syncytial Virus by PCR: NEGATIVE
SARS Coronavirus 2 by RT PCR: NEGATIVE

## 2024-04-25 LAB — GROUP A STREP BY PCR: Group A Strep by PCR: NOT DETECTED

## 2024-04-25 LAB — MONONUCLEOSIS SCREEN: Mono Screen: NEGATIVE

## 2024-04-25 MED ORDER — ACETAMINOPHEN 500 MG PO TABS
1000.0000 mg | ORAL_TABLET | Freq: Once | ORAL | Status: AC
  Administered 2024-04-25: 1000 mg via ORAL
  Filled 2024-04-25: qty 2

## 2024-04-25 MED ORDER — DEXAMETHASONE SODIUM PHOSPHATE 10 MG/ML IJ SOLN
10.0000 mg | Freq: Once | INTRAMUSCULAR | Status: AC
  Administered 2024-04-25: 10 mg via INTRAMUSCULAR
  Filled 2024-04-25: qty 1

## 2024-04-25 MED ORDER — PENICILLIN G BENZATHINE 1200000 UNIT/2ML IM SUSY
1.2000 10*6.[IU] | PREFILLED_SYRINGE | Freq: Once | INTRAMUSCULAR | Status: AC
  Administered 2024-04-25: 1.2 10*6.[IU] via INTRAMUSCULAR
  Filled 2024-04-25: qty 2

## 2024-04-25 NOTE — ED Provider Notes (Signed)
 [place] EMERGENCY DEPARTMENT AT [facility] Provider Note   CSN: [id number] Arrival date & time: 04/25/24  9147     History  Chief Complaint  Patient presents with   Sore Throat   Fever    Phyllis Petersen is a 28 y.o. female with no documented medical history.  Patient presents to ED for evaluation of sore throat, trouble swallowing and shortness of breath.  Reports that symptoms began Saturday.  Endorsing fevers at home, sore throat.  Also endorsing shortness of breath but denies chest pain.  Denies known sick contacts.  Denies nausea, vomiting or diarrhea.  Denies trouble swallowing.  Reports she took Tylenol cold this morning around 4 AM.   Sore Throat  Fever Associated symptoms: sore throat        Home Medications Prior to Admission medications   Medication Sig Start Date End Date Taking? Authorizing Provider  acetaminophen (TYLENOL) 500 MG tablet Take 1,000 mg by mouth every 6 (six) hours as needed for mild pain or headache.    [provider]  ondansetron  (ZOFRAN  ODT) 8 MG disintegrating tablet Take 1 tablet (8 mg total) by mouth every 8 (eight) hours as needed for nausea or vomiting. Patient not taking: Reported on 04/09/2020 01/29/19   Auston Blush, MD  oseltamivir  (TAMIFLU ) 75 MG capsule Take 1 capsule (75 mg total) by mouth every 12 (twelve) hours. Patient not taking: Reported on 04/09/2020 01/29/19   Auston Blush, MD      Allergies    Patient has no known allergies.    Review of Systems   Review of Systems  Constitutional:  Positive for fever.  HENT:  Positive for sore throat. Negative for trouble swallowing and voice change.   All other systems reviewed and are negative.   Physical Exam Updated Vital Signs BP 120/76   Pulse 80   Temp 99.1 F (37.3 C)   Resp 18   LMP 04/11/2024 (Approximate)   SpO2 100%  Physical Exam Vitals and nursing note reviewed.  Constitutional:      General: She is not in acute distress.    Appearance:  She is well-developed.  HENT:     Head: Normocephalic and atraumatic.     Mouth/Throat:     Tonsils: Tonsillar exudate present. 1+ on the right. 1+ on the left.  Eyes:     Conjunctiva/sclera: Conjunctivae normal.  Cardiovascular:     Rate and Rhythm: Normal rate and regular rhythm.     Heart sounds: No murmur heard. Pulmonary:     Effort: Pulmonary effort is normal. No respiratory distress.     Breath sounds: Normal breath sounds.  Abdominal:     Palpations: Abdomen is soft.     Tenderness: There is no abdominal tenderness.  Musculoskeletal:        General: No swelling.     Cervical back: Neck supple.  Skin:    General: Skin is warm and dry.     Capillary Refill: Capillary refill takes less than 2 seconds.  Neurological:     Mental Status: She is alert.  Psychiatric:        Mood and Affect: Mood normal.     ED Results / Procedures / Treatments   Labs (all labs ordered are listed, but only abnormal results are displayed) Labs Reviewed  RESP PANEL BY RT-PCR (RSV, FLU A&B, COVID)  RVPGX2  GROUP A STREP BY PCR  MONONUCLEOSIS SCREEN    EKG None  Radiology DG Chest Portable 1 View Result  Date: 04/25/2024 CLINICAL DATA:  sob EXAM: PORTABLE CHEST - 1 VIEW COMPARISON:  February 17, 2018 FINDINGS: Low lung volumes. No focal airspace consolidation, pleural effusion, or pneumothorax. No cardiomegaly. No acute fracture or destructive lesion. IMPRESSION: Low lung volumes.  Otherwise, no acute cardiopulmonary abnormality. Electronically Signed   By: Rance Burrows M.D.   On: 04/25/2024 10:23    Procedures Procedures    Medications Ordered in ED Medications  acetaminophen (TYLENOL) tablet 1,000 mg (1,000 mg Oral Given 04/25/24 0949)  dexamethasone (DECADRON) injection 10 mg (10 mg Intramuscular Given 04/25/24 1059)  penicillin g benzathine (BICILLIN LA) 1200000 UNIT/2ML injection 1.2 Million Units (1.2 Million Units Intramuscular Given 04/25/24 1311)    ED Course/ Medical  Decision Making/ A&P Clinical Course as of 04/25/24 1358  Wed Apr 25, 2024  0935 Patchy white exudate to bilateral tonsils [CG]    Clinical Course User Index [CG] Adel Aden, PA-C   Medical Decision Making Amount and/or Complexity of Data Reviewed Labs: ordered. Radiology: ordered.  Risk OTC drugs. Prescription drug management.   28 year old female presents for evaluation.  Please see HPI for further details.  On examination the patient is afebrile and nontachycardic.  Her lung sounds are clear bilaterally, not hypoxic.  Abdomen soft and compressible.  Neurological examinations baseline.  Patient posterior oropharynx with erythema, bilateral patchy exudate to her bilateral 1+ swollen tonsils.  She is handling her secretions appropriately, there is no drooling, there is no change in phonation.  Her uvula is midline.  No sign of Ludwig angina.  Will provide patient 10 mg IM Decadron shot.  Will collect viral panel, group A strep, mono, chest x-ray.  Chest x-ray unremarkable.  Viral panel negative for all.  Group A strep negative for all.  Mononucleosis screen negative.  Based on patchy white exudate to bilateral tonsils in her posterior oropharynx, will treat patient for strep throat today with 1,200,000 units Bicillin shot.  Patient was advised to take ibuprofen and Tylenol for pain at home.  Encouraged to return to the ED in 3 days if no resolution of symptoms.  She voiced understanding.  She was given strict return precautions and she voiced understanding.  Stable to discharge.   Final Clinical Impression(s) / ED Diagnoses Final diagnoses:  Strep pharyngitis    Rx / DC Orders ED Discharge Orders     None         Adel Aden, PA-C 04/25/24 1359    Arvilla Birmingham, MD 04/25/24 1447

## 2024-04-25 NOTE — Discharge Instructions (Signed)
 It was a pleasure taking part in your care.  As discussed, I believe you have strep throat based on clinical exam.  We have given you a antibiotic which should help with your symptoms as well as a steroid.  Please continue to take ibuprofen or Tylenol for pain in your throat.  Please return to the ED if your symptoms do not resolve in 48 to 72 hours.

## 2024-04-25 NOTE — ED Triage Notes (Signed)
 Pt presents with c/o fever, sore throat for several days.

## 2024-04-29 ENCOUNTER — Ambulatory Visit (HOSPITAL_COMMUNITY)
Admission: RE | Admit: 2024-04-29 | Discharge: 2024-04-29 | Disposition: A | Discharge status: Home / Self Care | Payer: Self-pay | Source: Ambulatory Visit | Attending: Family Medicine | Admitting: Family Medicine

## 2024-04-29 ENCOUNTER — Encounter (HOSPITAL_COMMUNITY): Payer: Self-pay

## 2024-04-29 VITALS — BP 103/71 | HR 103 | Temp 100.0°F | Resp 18 | Ht 60.0 in | Wt 200.0 lb

## 2024-04-29 DIAGNOSIS — J029 Acute pharyngitis, unspecified: Secondary | ICD-10-CM | POA: Insufficient documentation

## 2024-04-29 LAB — POCT RAPID STREP A (OFFICE): Rapid Strep A Screen: NEGATIVE

## 2024-04-29 MED ORDER — AMOXICILLIN 500 MG PO CAPS
500.0000 mg | ORAL_CAPSULE | Freq: Two times a day (BID) | ORAL | 0 refills | Status: AC
Start: 2024-04-29 — End: 2024-05-09

## 2024-04-29 NOTE — ED Provider Notes (Signed)
 [facility]    CSN: [id number] Arrival date & time: 04/29/24  1253      History   Chief Complaint Chief Complaint  Patient presents with   Sore Throat    Entered by patient    HPI Phyllis Petersen is a 28 y.o. female.   The history is provided by the patient. No language interpreter was used.  Sore Throat This is a new problem. Episode onset: 8 days ago started having throat pain. The problem occurs constantly. The problem has not changed since onset.Pertinent negatives include no chest pain, no abdominal pain, no headaches and no shortness of breath. Associated symptoms comments: She had a fever of 100.8 yesterday. . The symptoms are aggravated by swallowing, drinking and eating. Nothing relieves the symptoms. She has tried acetaminophen  for the symptoms.  She was seen at [facility] on 04/25/24 with negative, COVID-19, influenza, mono and strep.  Denies any hx of oral sex and will not need oral STD screen.   History reviewed. No pertinent past medical history.  There are no active problems to display for this patient.   History reviewed. No pertinent surgical history.  OB History   No obstetric history on file.      Home Medications    Prior to Admission medications   Medication Sig Start Date End Date Taking? Authorizing Provider  amoxicillin (AMOXIL) 500 MG capsule Take 1 capsule (500 mg total) by mouth 2 (two) times daily for 10 days. 04/29/24 05/09/24 Yes Arn Lane, MD  acetaminophen  (TYLENOL ) 500 MG tablet Take 1,000 mg by mouth every 6 (six) hours as needed for mild pain or headache.    [provider]  ondansetron  (ZOFRAN  ODT) 8 MG disintegrating tablet Take 1 tablet (8 mg total) by mouth every 8 (eight) hours as needed for nausea or vomiting. Patient not taking: Reported on 04/09/2020 01/29/19   Auston Blush, MD  oseltamivir  (TAMIFLU ) 75 MG capsule Take 1 capsule (75 mg total) by mouth every 12 (twelve) hours. Patient not taking: Reported on  04/09/2020 01/29/19   Auston Blush, MD    Family History Family History  Problem Relation Age of Onset   Healthy Mother    Healthy Father     Social History Social History   Tobacco Use   Smoking status: Every Day    Types: Cigarettes   Smokeless tobacco: Never  Vaping Use   Vaping status: Never Used  Substance Use Topics   Alcohol use: No   Drug use: Never     Allergies   Patient has no known allergies.   Review of Systems Review of Systems  Respiratory:  Negative for shortness of breath.   Cardiovascular:  Negative for chest pain.  Gastrointestinal:  Negative for abdominal pain.  Neurological:  Negative for headaches.  All other systems reviewed and are negative.    Physical Exam Triage Vital Signs ED Triage Vitals  Encounter Vitals Group     BP      Systolic BP Percentile      Diastolic BP Percentile      Pulse      Resp      Temp      Temp src      SpO2      Weight      Height      Head Circumference      Peak Flow      Pain Score      Pain Loc      Pain  Education      Exclude from Growth Chart    No data found.  Updated Vital Signs BP 103/71 (BP Location: Right Arm)   Pulse (!) 103   Temp 100 F (37.8 C) (Oral)   Resp 18   Ht 5' (1.524 m)   Wt 90.7 kg   LMP 04/11/2024 (Approximate)   SpO2 96%   BMI 39.06 kg/m   Visual Acuity Right Eye Distance:   Left Eye Distance:   Bilateral Distance:    Right Eye Near:   Left Eye Near:    Bilateral Near:     Physical Exam Vitals and nursing note reviewed.  Constitutional:      General: She is not in acute distress. HENT:     Mouth/Throat:     Pharynx: Oropharyngeal exudate present.     Comments: Mild tonsillar erythema with thick yellowish exudates on tonsils B/L Cardiovascular:     Rate and Rhythm: Normal rate and regular rhythm.     Heart sounds: Normal heart sounds. No murmur heard. Pulmonary:     Effort: Pulmonary effort is normal. No respiratory distress.     Breath sounds:  Normal breath sounds. No stridor. No wheezing.  Neurological:     Mental Status: She is alert.      UC Treatments / Results  Labs (all labs ordered are listed, but only abnormal results are displayed) Labs Reviewed  CULTURE, GROUP A STREP [facility])  POCT RAPID STREP A (OFFICE)    EKG   Radiology No results found.  Procedures Procedures (including critical care time)  Medications Ordered in UC Medications - No data to display  Initial Impression / Assessment and Plan / UC Course  I have reviewed the triage vital signs and the nursing notes.  Pertinent labs & imaging results that were available during my care of the patient were reviewed by me and considered in my medical decision making (see chart for details).  Clinical Course as of 04/29/24 1342  Sun Apr 29, 2024  1341 Pharyngitis - etiology unclear She had a recent ED visit with negative rapid strep and mono tests However, clinically, it looks like strep pharyngitis Throat culture sent Denies STD risk - hence, oral STD screen not conducted today (Offered, but denied risk) We will contact her soon with the results In the meantime, I sent in Amox to her pharmacy Use Tylenol  as needed for pain Warm saline gurgles were discussed F/U soon if needed [KE]    Clinical Course User Index [KE] Arn Lane, MD     Final Clinical Impressions(s) / UC Diagnoses   Final diagnoses:  Pharyngitis, unspecified etiology     Discharge Instructions      It was nice seeing you. Your rapid strep test is negative again. We sent your throat specimen for culture. In the meantime, I will treat you with an antibiotic. We did not test you for throat STD infection since you denied exposure Use Tylenol  as needed for pain and fever. Use warm saline gurgle twice daily to rinse your tonsils See us  or your PCP soon if there is no improvement.      ED Prescriptions     Medication Sig Dispense Auth. Provider   amoxicillin  (AMOXIL) 500 MG capsule Take 1 capsule (500 mg total) by mouth 2 (two) times daily for 10 days. 20 capsule Arn Lane, MD      PDMP not reviewed this encounter.   Arn Lane, MD 04/29/24 [phone]

## 2024-04-29 NOTE — Discharge Instructions (Signed)
 It was nice seeing you. Your rapid strep test is negative again. We sent your throat specimen for culture. In the meantime, I will treat you with an antibiotic. We did not test you for throat STD infection since you denied exposure Use Tylenol  as needed for pain and fever. Use warm saline gurgle twice daily to rinse your tonsils See us  or your PCP soon if there is no improvement.

## 2024-04-29 NOTE — ED Triage Notes (Signed)
 Patient was diagnosed with strep throat on Wednesday in the ER.  Patient was given an injection for treatment.  Patient is no better.  Still having pain.  Taken Tylenol  for pain.

## 2024-05-01 ENCOUNTER — Encounter: Payer: Self-pay | Admitting: Family Medicine

## 2024-05-01 LAB — CULTURE, GROUP A STREP (THRC)

## 2024-05-14 ENCOUNTER — Ambulatory Visit (HOSPITAL_COMMUNITY)
Admission: RE | Admit: 2024-05-14 | Discharge: 2024-05-14 | Disposition: A | Discharge status: Home / Self Care | Payer: Self-pay | Source: Ambulatory Visit | Attending: Emergency Medicine | Admitting: Emergency Medicine

## 2024-05-14 ENCOUNTER — Encounter (HOSPITAL_COMMUNITY): Payer: Self-pay

## 2024-05-14 VITALS — BP 106/67 | HR 97 | Temp 98.8°F | Resp 16

## 2024-05-14 DIAGNOSIS — J069 Acute upper respiratory infection, unspecified: Secondary | ICD-10-CM

## 2024-05-14 MED ORDER — BENZONATATE 100 MG PO CAPS: 100.0000 mg | ORAL_CAPSULE | Freq: Three times a day (TID) | ORAL | 0 refills | Status: AC

## 2024-05-14 MED ORDER — PROMETHAZINE-DM 6.25-15 MG/5ML PO SYRP: 5.0000 mL | ORAL_SOLUTION | Freq: Every evening | ORAL | 0 refills | Status: AC | PRN

## 2024-05-14 MED ORDER — AZELASTINE HCL 0.1 % NA SOLN: 2.0000 | Freq: Two times a day (BID) | NASAL | 0 refills | Status: AC

## 2024-05-14 NOTE — ED Triage Notes (Signed)
 Patient states that she had strep on 4/30 and pharyngitis on 04/29/24. Patient states she has a cough now and constantly feels like something is in her throat and vomits several times a day. Patent states she is unable to eat.

## 2024-05-14 NOTE — Discharge Instructions (Signed)
 As discussed I believe your symptoms are likely related to a viral respiratory illness. You can take Tessalon  every 8 hours as needed for cough. Take promethazine  DM cough syrup at bedtime as needed for cough.  This can make you drowsy so do not drive, work, drink alcohol, or take this with NyQuil. Use azelastine  nasal spray twice daily to help with congestion. Alternate between 650 mg of Tylenol  and 400 mg of ibuprofen as needed for any pain or fever. Make sure you are staying hydrated and getting plenty of rest. Return here as needed.

## 2024-05-14 NOTE — ED Provider Notes (Signed)
 [facility]    CSN: [id number] Arrival date & time: 05/14/24  1547      History   Chief Complaint Chief Complaint  Patient presents with   Cough    Got over strep throat recently. Now throat feels like mucus is stuck and I keep randomly throwing up. - Entered by patient   Emesis    HPI Phyllis Petersen is a 28 y.o. female.   Patient presents with cough, congestion, and sensation of something in her throat that began approximately 3 to 4 days ago..  Patient states that her cough and the sensation in her throat have caused her to vomit a few times over the last few days.  Patient reports some difficulty eating due to this.  Denies abdominal pain, diarrhea, nausea, and fever.  Patient states that she was recently seen and treated for strep throat and then seen again and was told she had pharyngitis.  Patient denies sore throat.  Patient states that she completed the antibiotics and her symptoms resolved, but then she began to feel sick again a few days ago.  The history is provided by the patient and medical records.  Cough Emesis Associated symptoms: cough     History reviewed. No pertinent past medical history.  There are no active problems to display for this patient.   History reviewed. No pertinent surgical history.  OB History   No obstetric history on file.      Home Medications    Prior to Admission medications   Medication Sig Start Date End Date Taking? Authorizing Provider  azelastine  (ASTELIN ) 0.1 % nasal spray Place 2 sprays into both nostrils 2 (two) times daily. Use in each nostril as directed 05/14/24  Yes Levora Reas A, NP  benzonatate  (TESSALON ) 100 MG capsule Take 1 capsule (100 mg total) by mouth every 8 (eight) hours. 05/14/24  Yes Levora Reas A, NP  promethazine -dextromethorphan (PROMETHAZINE -DM) 6.25-15 MG/5ML syrup Take 5 mLs by mouth at bedtime as needed for cough. 05/14/24  Yes Levora Reas A, NP  acetaminophen  (TYLENOL )  500 MG tablet Take 1,000 mg by mouth every 6 (six) hours as needed for mild pain or headache.    [provider]  ondansetron  (ZOFRAN  ODT) 8 MG disintegrating tablet Take 1 tablet (8 mg total) by mouth every 8 (eight) hours as needed for nausea or vomiting. Patient not taking: Reported on 04/09/2020 01/29/19   Auston Blush, MD  oseltamivir  (TAMIFLU ) 75 MG capsule Take 1 capsule (75 mg total) by mouth every 12 (twelve) hours. Patient not taking: Reported on 04/09/2020 01/29/19   Auston Blush, MD    Family History Family History  Problem Relation Age of Onset   Healthy Mother    Healthy Father     Social History Social History   Tobacco Use   Smoking status: Former    Types: Cigarettes   Smokeless tobacco: Never  Vaping Use   Vaping status: Never Used  Substance Use Topics   Alcohol use: No   Drug use: Never     Allergies   Patient has no known allergies.   Review of Systems Review of Systems  Respiratory:  Positive for cough.   Gastrointestinal:  Positive for vomiting.   Per HPI  Physical Exam Triage Vital Signs ED Triage Vitals  Encounter Vitals Group     BP 05/14/24 1608 106/67     Systolic BP Percentile --      Diastolic BP Percentile --      Pulse  Rate 05/14/24 1608 97     Resp 05/14/24 1608 16     Temp 05/14/24 1608 98.8 F (37.1 C)     Temp Source 05/14/24 1608 Oral     SpO2 05/14/24 1608 94 %     Weight --      Height --      Head Circumference --      Peak Flow --      Pain Score 05/14/24 1607 0     Pain Loc --      Pain Education --      Exclude from Growth Chart --    No data found.  Updated Vital Signs BP 106/67 (BP Location: Left Arm)   Pulse 97   Temp 98.8 F (37.1 C) (Oral)   Resp 16   LMP 05/14/2024 (Approximate)   SpO2 94%   Visual Acuity Right Eye Distance:   Left Eye Distance:   Bilateral Distance:    Right Eye Near:   Left Eye Near:    Bilateral Near:     Physical Exam Vitals and nursing note reviewed.   Constitutional:      General: She is awake. She is not in acute distress.    Appearance: Normal appearance. She is well-developed and well-groomed. She is not ill-appearing.  HENT:     Right Ear: Tympanic membrane, ear canal and external ear normal.     Left Ear: Tympanic membrane, ear canal and external ear normal.     Nose: Congestion and rhinorrhea present.     Mouth/Throat:     Mouth: Mucous membranes are moist.     Pharynx: Posterior oropharyngeal erythema and postnasal drip present. No pharyngeal swelling or oropharyngeal exudate.     Tonsils: No tonsillar exudate or tonsillar abscesses.  Cardiovascular:     Rate and Rhythm: Normal rate and regular rhythm.  Pulmonary:     Effort: Pulmonary effort is normal.     Breath sounds: Normal breath sounds.  Skin:    General: Skin is warm and dry.  Neurological:     Mental Status: She is alert.  Psychiatric:        Behavior: Behavior is cooperative.      UC Treatments / Results  Labs (all labs ordered are listed, but only abnormal results are displayed) Labs Reviewed - No data to display  EKG   Radiology No results found.  Procedures Procedures (including critical care time)  Medications Ordered in UC Medications - No data to display  Initial Impression / Assessment and Plan / UC Course  I have reviewed the triage vital signs and the nursing notes.  Pertinent labs & imaging results that were available during my care of the patient were reviewed by me and considered in my medical decision making (see chart for details).     Patient is well-appearing.  Vitals are stable.  Upon assessment congestion and rhinorrhea present, mild erythema and PND noted to pharynx.  Lungs clear bilaterally to auscultation.  No evidence of strep on exam.  No in nature.  Prescribed Tessalon  and Promethazine  DM as needed for cough.  Prescribed azelastine  for congestion.  Discussed over-the-counter medication as needed for symptoms.  Discussed  return precautions. Final Clinical Impressions(s) / UC Diagnoses   Final diagnoses:  Viral URI with cough     Discharge Instructions      As discussed I believe your symptoms are likely related to a viral respiratory illness. You can take Tessalon  every 8 hours as needed for  cough. Take promethazine  DM cough syrup at bedtime as needed for cough.  This can make you drowsy so do not drive, work, drink alcohol, or take this with NyQuil. Use azelastine  nasal spray twice daily to help with congestion. Alternate between 650 mg of Tylenol  and 400 mg of ibuprofen as needed for any pain or fever. Make sure you are staying hydrated and getting plenty of rest. Return here as needed.  ED Prescriptions     Medication Sig Dispense Auth. Provider   benzonatate  (TESSALON ) 100 MG capsule Take 1 capsule (100 mg total) by mouth every 8 (eight) hours. 21 capsule Levora Reas A, NP   promethazine -dextromethorphan (PROMETHAZINE -DM) 6.25-15 MG/5ML syrup Take 5 mLs by mouth at bedtime as needed for cough. 118 mL Rosevelt Constable, Oshea Percival A, NP   azelastine  (ASTELIN ) 0.1 % nasal spray Place 2 sprays into both nostrils 2 (two) times daily. Use in each nostril as directed 30 mL Levora Reas A, NP      PDMP not reviewed this encounter.   Levora Reas A, NP 05/14/24 [phone]

## 2024-05-17 ENCOUNTER — Other Ambulatory Visit: Payer: Self-pay

## 2024-05-17 ENCOUNTER — Emergency Department (HOSPITAL_COMMUNITY): Payer: Self-pay

## 2024-05-17 ENCOUNTER — Emergency Department (HOSPITAL_COMMUNITY)
Admission: EM | Admit: 2024-05-17 | Discharge: 2024-05-17 | Disposition: A | Discharge status: Home / Self Care | Payer: Self-pay | Attending: Emergency Medicine | Admitting: Emergency Medicine

## 2024-05-17 ENCOUNTER — Encounter (HOSPITAL_COMMUNITY): Payer: Self-pay | Admitting: Emergency Medicine

## 2024-05-17 DIAGNOSIS — K8080 Other cholelithiasis without obstruction: Secondary | ICD-10-CM | POA: Insufficient documentation

## 2024-05-17 DIAGNOSIS — R09A2 Foreign body sensation, throat: Secondary | ICD-10-CM | POA: Insufficient documentation

## 2024-05-17 DIAGNOSIS — Z87891 Personal history of nicotine dependence: Secondary | ICD-10-CM | POA: Insufficient documentation

## 2024-05-17 LAB — COMPREHENSIVE METABOLIC PANEL WITH GFR
ALT: 41 U/L (ref 0–44)
AST: 37 U/L (ref 15–41)
Albumin: 4.3 g/dL (ref 3.5–5.0)
Alkaline Phosphatase: 59 U/L (ref 38–126)
Anion gap: 12 (ref 5–15)
BUN: 14 mg/dL (ref 6–20)
CO2: 22 mmol/L (ref 22–32)
Calcium: 9.6 mg/dL (ref 8.9–10.3)
Chloride: 103 mmol/L (ref 98–111)
Creatinine, Ser: 0.87 mg/dL (ref 0.44–1.00)
GFR, Estimated: >60 mL/min (ref >60)
Glucose, Bld: 93 mg/dL (ref 70–99)
Potassium: 3.6 mmol/L (ref 3.5–5.1)
Sodium: 137 mmol/L (ref 135–145)
Total Bilirubin: 1.4 mg/dL — ABNORMAL HIGH (ref 0.0–1.2)
Total Protein: 8.6 g/dL — ABNORMAL HIGH (ref 6.5–8.1)

## 2024-05-17 LAB — URINALYSIS, ROUTINE W REFLEX MICROSCOPIC
Glucose, UA: NEGATIVE mg/dL
Hgb urine dipstick: NEGATIVE
Ketones, ur: 20 mg/dL — AB
Nitrite: NEGATIVE
Protein, ur: 100 mg/dL — AB
Specific Gravity, Urine: 1.035 — ABNORMAL HIGH (ref 1.005–1.030)
pH: 5 (ref 5.0–8.0)

## 2024-05-17 LAB — CBC
HCT: 38.9 % (ref 36.0–46.0)
Hemoglobin: 11.7 g/dL — ABNORMAL LOW (ref 12.0–15.0)
MCH: 21.1 pg — ABNORMAL LOW (ref 26.0–34.0)
MCHC: 30.1 g/dL (ref 30.0–36.0)
MCV: 70.1 fL — ABNORMAL LOW (ref 80.0–100.0)
Platelets: 269 10*3/uL (ref 150–400)
RBC: 5.55 MIL/uL — ABNORMAL HIGH (ref 3.87–5.11)
RDW: 17.3 % — ABNORMAL HIGH (ref 11.5–15.5)
WBC: 3.9 10*3/uL — ABNORMAL LOW (ref 4.0–10.5)
nRBC: 0 % (ref 0.0–0.2)

## 2024-05-17 LAB — LIPASE, BLOOD: Lipase: 24 U/L (ref 11–51)

## 2024-05-17 LAB — HCG, SERUM, QUALITATIVE: Preg, Serum: NEGATIVE

## 2024-05-17 MED ORDER — ONDANSETRON HCL 4 MG PO TABS: 4.0000 mg | ORAL_TABLET | Freq: Four times a day (QID) | ORAL | 0 refills | Status: AC

## 2024-05-17 MED ORDER — DEXAMETHASONE 4 MG PO TABS
4.0000 mg | ORAL_TABLET | Freq: Once | ORAL | Status: AC
  Administered 2024-05-17: 4 mg via ORAL
  Filled 2024-05-17: qty 1

## 2024-05-17 MED ORDER — LIDOCAINE VISCOUS HCL 2 % MT SOLN
15.0000 mL | Freq: Once | OROMUCOSAL | Status: AC
  Administered 2024-05-17: 15 mL via ORAL
  Filled 2024-05-17: qty 15

## 2024-05-17 MED ORDER — IOHEXOL 300 MG/ML  SOLN
75.0000 mL | Freq: Once | INTRAMUSCULAR | Status: AC | PRN
  Administered 2024-05-17: 75 mL via INTRAVENOUS

## 2024-05-17 MED ORDER — LACTATED RINGERS IV BOLUS
1000.0000 mL | Freq: Once | INTRAVENOUS | Status: AC
  Administered 2024-05-17: 1000 mL via INTRAVENOUS

## 2024-05-17 MED ORDER — FAMOTIDINE 20 MG PO TABS
40.0000 mg | ORAL_TABLET | Freq: Once | ORAL | Status: AC
Start: 2024-05-17 — End: 2024-05-17
  Administered 2024-05-17: 40 mg via ORAL
  Filled 2024-05-17: qty 2

## 2024-05-17 MED ORDER — ALUM & MAG HYDROXIDE-SIMETH 200-200-20 MG/5ML PO SUSP
30.0000 mL | Freq: Once | ORAL | Status: AC
  Administered 2024-05-17: 30 mL via ORAL
  Filled 2024-05-17: qty 30

## 2024-05-17 MED ORDER — ONDANSETRON 4 MG PO TBDP
4.0000 mg | ORAL_TABLET | Freq: Once | ORAL | Status: AC | PRN
  Administered 2024-05-17: 4 mg via ORAL
  Filled 2024-05-17: qty 1

## 2024-05-17 MED ORDER — FAMOTIDINE 40 MG PO TABS: 40.0000 mg | ORAL_TABLET | Freq: Every day | ORAL | 0 refills | Status: AC

## 2024-05-17 NOTE — Discharge Instructions (Addendum)
 While you were in the emergency room, you CT scans done of your neck had that showed some swollen lymph nodes.  This usually will be because of a recent infection.  Your symptoms should begin to improve over the next couple of weeks.  You can take Motrin and Tylenol .  Your ultrasound of your gallbladder showed stones which can sometimes cause some of the discomfort that you are experiencing.  I have sent you a referral to a general surgery office.  You can call them to set up an appointment.  You can tell them that you were seen in the emergency department and diagnosed with biliary colic.  I have sent you prescriptions for 2 medicines.  The first is famotidine.  This is a medicine that can help with stomach acid and may be helpful with your symptoms.  The second is Zofran  which is a nausea medicine.

## 2024-05-17 NOTE — ED Triage Notes (Signed)
 Patient presents due to emesis. She was recently treated for strep and finished the antibiotics. This past Wednesday she feels as though something became stuck in her throat and has been vomiting since. She has had abdominal pain and low PO intake due to vomiting.

## 2024-05-17 NOTE — ED Provider Notes (Signed)
 [place] EMERGENCY DEPARTMENT AT [facility] Provider Note  CSN: [id number] Arrival date & time: 05/17/24 1505  Chief Complaint(s) Emesis  HPI Phyllis Petersen is a 28 y.o. female who is here today for a foreign body sensation in her throat.  She reports that she recently completed treatment for strep pharyngitis, had been feeling better.  Unfortunately, she began to experience lack of an appetite, and had several episodes of vomiting.  Since then, she has felt a globus sensation in her throat.  Patient also endorses some right upper quadrant and epigastric pain that began starting yesterday.  Patient otherwise healthy.   Past Medical History History reviewed. No pertinent past medical history. There are no active problems to display for this patient.  Home Medication(s) Prior to Admission medications   Medication Sig Start Date End Date Taking? Authorizing Provider  famotidine (PEPCID) 40 MG tablet Take 1 tablet (40 mg total) by mouth daily. 05/17/24  Yes Afton Horse T, DO  ondansetron  (ZOFRAN ) 4 MG tablet Take 1 tablet (4 mg total) by mouth every 6 (six) hours. 05/17/24  Yes Afton Horse T, DO  acetaminophen  (TYLENOL ) 500 MG tablet Take 1,000 mg by mouth every 6 (six) hours as needed for mild pain or headache.    [provider]  azelastine  (ASTELIN ) 0.1 % nasal spray Place 2 sprays into both nostrils 2 (two) times daily. Use in each nostril as directed 05/14/24   Levora Reas A, NP  benzonatate  (TESSALON ) 100 MG capsule Take 1 capsule (100 mg total) by mouth every 8 (eight) hours. 05/14/24   Levora Reas A, NP  ondansetron  (ZOFRAN  ODT) 8 MG disintegrating tablet Take 1 tablet (8 mg total) by mouth every 8 (eight) hours as needed for nausea or vomiting. Patient not taking: Reported on 04/09/2020 01/29/19   Auston Blush, MD  oseltamivir  (TAMIFLU ) 75 MG capsule Take 1 capsule (75 mg total) by mouth every 12 (twelve) hours. Patient not taking: Reported on  04/09/2020 01/29/19   Auston Blush, MD  promethazine -dextromethorphan (PROMETHAZINE -DM) 6.25-15 MG/5ML syrup Take 5 mLs by mouth at bedtime as needed for cough. 05/14/24   Karon Packer, NP                                                                                                                                    Past Surgical History History reviewed. No pertinent surgical history. Family History Family History  Problem Relation Age of Onset   Healthy Mother    Healthy Father     Social History Social History   Tobacco Use   Smoking status: Former    Types: Cigarettes   Smokeless tobacco: Never  Vaping Use   Vaping status: Never Used  Substance Use Topics   Alcohol use: No   Drug use: Never   Allergies Patient has no known allergies.  Review of Systems Review of Systems  Physical Exam Vital Signs  I  have reviewed the triage vital signs BP (!) 124/105   Pulse 81   Temp 98.8 F (37.1 C) (Oral)   Resp 18   LMP 05/14/2024 (Approximate)   SpO2 100%   Physical Exam Vitals and nursing note reviewed.  HENT:     Mouth/Throat:     Mouth: Mucous membranes are moist.     Pharynx: No posterior oropharyngeal erythema.     Comments: Uvula midline Neck:     Comments: Non stridulous Cardiovascular:     Rate and Rhythm: Normal rate.  Pulmonary:     Effort: Pulmonary effort is normal.  Abdominal:     General: Abdomen is flat.     Palpations: Abdomen is soft.     Tenderness: There is abdominal tenderness.     Comments: Epigastric tenderness, negative Murphy sign  Musculoskeletal:     Cervical back: Normal range of motion and neck supple.  Skin:    General: Skin is warm.  Neurological:     Mental Status: She is alert.     Cranial Nerves: No cranial nerve deficit.     ED Results and Treatments Labs (all labs ordered are listed, but only abnormal results are displayed) Labs Reviewed  COMPREHENSIVE METABOLIC PANEL WITH GFR - Abnormal; Notable for the  following components:      Result Value   Total Protein 8.6 (*)    Total Bilirubin 1.4 (*)    All other components within normal limits  CBC - Abnormal; Notable for the following components:   WBC 3.9 (*)    RBC 5.55 (*)    Hemoglobin 11.7 (*)    MCV 70.1 (*)    MCH 21.1 (*)    RDW 17.3 (*)    All other components within normal limits  URINALYSIS, ROUTINE W REFLEX MICROSCOPIC - Abnormal; Notable for the following components:   Color, Urine AMBER (*)    APPearance HAZY (*)    Specific Gravity, Urine 1.035 (*)    Bilirubin Urine SMALL (*)    Ketones, ur 20 (*)    Protein, ur 100 (*)    Leukocytes,Ua TRACE (*)    Bacteria, UA RARE (*)    All other components within normal limits  LIPASE, BLOOD  HCG, SERUM, QUALITATIVE                                                                                                                          Radiology CT Soft Tissue Neck W Contrast Result Date: 05/17/2024 CLINICAL DATA:  Initial evaluation for suspected foreign body. EXAM: CT NECK WITH CONTRAST TECHNIQUE: Multidetector CT imaging of the neck was performed using the standard protocol following the bolus administration of intravenous contrast. RADIATION DOSE REDUCTION: This exam was performed according to the departmental dose-optimization program which includes automated exposure control, adjustment of the mA and/or kV according to patient size and/or use of iterative reconstruction technique. CONTRAST:  75mL OMNIPAQUE IOHEXOL 300 MG/ML  SOLN COMPARISON:  None Available. FINDINGS:  Pharynx and larynx: Oral cavity within normal limits. Oropharynx and nasopharynx within normal limits. No retropharyngeal collection or swelling. Negative epiglottis. Vallecula partially effaced by the lingual tonsils. Hypopharynx, supraglottic larynx, glottis within normal limits. Subglottic airway clear. Visualized esophagus within normal limits. No radiopaque foreign body evident by CT. Salivary glands: Salivary  glands including the parotid and submandibular glands are within normal limits. Thyroid: Normal. Lymph nodes: Mildly prominent left level 1 B lymph node measures 1.2 cm in short axis, nonspecific, but could be reactive (series 3, image 60). No other large or pathologic lymph nodes within the neck. Vascular: Normal vascular enhancement seen within the neck. Limited intracranial: Unremarkable. Visualized orbits: Unremarkable. Mastoids and visualized paranasal sinuses: Clear/normal. Skeleton: No worrisome osseous lesions. Upper chest: No other acute finding. Other: None. IMPRESSION: 1. Negative CT of the neck. No radiopaque foreign body evident by CT. 2. Mildly prominent left level 1b lymph node measuring 1.2 cm in short axis, nonspecific, but could be reactive. Clinical follow-up to resolution recommended. Electronically Signed   By: Virgia Griffins M.D.   On: 05/17/2024 17:57   US  Abdomen Limited RUQ (LIVER/GB) Result Date: 05/17/2024 CLINICAL DATA:  RIGHT upper quadrant abdominal pain EXAM: ULTRASOUND ABDOMEN LIMITED RIGHT UPPER QUADRANT COMPARISON:  CT abdomen pelvis without contrast 07/05/2018 FINDINGS: Gallbladder: Gallstones: Moderate cholelithiasis Sludge: None Gallbladder Wall: Within normal limits Pericholecystic fluid: None Sonographic Murphy's Sign: Negative per technologist Common bile duct: Diameter: 3 mm Liver: Parenchymal echogenicity: Within normal limits Contours: Normal Lesions: None Portal vein: Patent.  Hepatopetal flow Other: None. IMPRESSION: Cholelithiasis without additional sonographic evidence of acute cholecystitis. Electronically Signed   By: Elester Grim M.D.   On: 05/17/2024 16:58    Pertinent labs & imaging results that were available during my care of the patient were reviewed by me and considered in my medical decision making (see MDM for details).  Medications Ordered in ED Medications  lactated ringers bolus 1,000 mL (has no administration in time range)  ondansetron   (ZOFRAN -ODT) disintegrating tablet 4 mg (4 mg Oral Given 05/17/24 1520)  alum & mag hydroxide-simeth (MAALOX/MYLANTA) 200-200-20 MG/5ML suspension 30 mL (30 mLs Oral Given 05/17/24 1614)    And  lidocaine (XYLOCAINE) 2 % viscous mouth solution 15 mL (15 mLs Oral Given 05/17/24 1614)  famotidine (PEPCID) tablet 40 mg (40 mg Oral Given 05/17/24 1614)  iohexol (OMNIPAQUE) 300 MG/ML solution 75 mL (75 mLs Intravenous Contrast Given 05/17/24 1720)                                                                                                                                     Procedures Procedures  (including critical care time)  Medical Decision Making / ED Course   This patient presents to the ED for concern of foreign body sensation and epigastric pain, this involves an extensive number of treatment options, and is a complaint that carries with it a high risk of complications  and morbidity.  The differential diagnosis includes cholelithiasis, peptic ulcer disease, gastritis, pharyngitis, RPA, PTA.  MDM: On exam, patient overall well-appearing.  Physical exam reassuring.  With the report of the foreign body sensation, will obtain imaging to assess for possible posterior deep space infection.  Also obtain ultrasound of the patient's right upper quadrant given risk factors and new onset of right upper quadrant pain.  With the early satiety that the patient describing, could be peptic ulcer presentation.  Will provide the patient with some famotidine here in the ED.  Reassessment 6:10 PM-patient with some lymphadenopathy, could certainly be contributing to her symptoms.  Less likely that this is malignancy given time, and her recent strep pharyngitis treatment.  Right a quadrant ultrasound shows cholelithiasis without cholecystitis.  Will provide patient with outpatient general surgery follow-up.  Patient able to tolerate p.o. here in the ED.  Will discharge.   Additional history obtained: -Additional  history obtained from  -External records from outside source obtained and reviewed including: Chart review including previous notes, labs, imaging, consultation notes   Lab Tests: -I ordered, reviewed, and interpreted labs.   The pertinent results include:   Labs Reviewed  COMPREHENSIVE METABOLIC PANEL WITH GFR - Abnormal; Notable for the following components:      Result Value   Total Protein 8.6 (*)    Total Bilirubin 1.4 (*)    All other components within normal limits  CBC - Abnormal; Notable for the following components:   WBC 3.9 (*)    RBC 5.55 (*)    Hemoglobin 11.7 (*)    MCV 70.1 (*)    MCH 21.1 (*)    RDW 17.3 (*)    All other components within normal limits  URINALYSIS, ROUTINE W REFLEX MICROSCOPIC - Abnormal; Notable for the following components:   Color, Urine AMBER (*)    APPearance HAZY (*)    Specific Gravity, Urine 1.035 (*)    Bilirubin Urine SMALL (*)    Ketones, ur 20 (*)    Protein, ur 100 (*)    Leukocytes,Ua TRACE (*)    Bacteria, UA RARE (*)    All other components within normal limits  LIPASE, BLOOD  HCG, SERUM, QUALITATIVE      EKG   EKG Interpretation Date/Time:    Ventricular Rate:    PR Interval:    QRS Duration:    QT Interval:    QTC Calculation:   R Axis:      Text Interpretation:           Imaging Studies ordered: I ordered imaging studies including CT imaging the neck, right upper quadrant ultrasound I independently visualized and interpreted imaging. I agree with the radiologist interpretation   Medicines ordered and prescription drug management: Meds ordered this encounter  Medications   ondansetron  (ZOFRAN -ODT) disintegrating tablet 4 mg   AND Linked Order Group    alum & mag hydroxide-simeth (MAALOX/MYLANTA) 200-200-20 MG/5ML suspension 30 mL    lidocaine (XYLOCAINE) 2 % viscous mouth solution 15 mL   famotidine (PEPCID) tablet 40 mg   iohexol (OMNIPAQUE) 300 MG/ML solution 75 mL   lactated ringers bolus 1,000  mL   ondansetron  (ZOFRAN ) 4 MG tablet    Sig: Take 1 tablet (4 mg total) by mouth every 6 (six) hours.    Dispense:  12 tablet    Refill:  0   famotidine (PEPCID) 40 MG tablet    Sig: Take 1 tablet (40 mg total) by mouth daily.  Dispense:  30 tablet    Refill:  0    -I have reviewed the patients home medicines and have made adjustments as needed    Social Determinants of Health:  Factors impacting patients care include: Lack of access to primary care   Reevaluation: After the interventions noted above, I reevaluated the patient and found that they have :improved  Co morbidities that complicate the patient evaluation History reviewed. No pertinent past medical history.    Dispostion: I considered admission for this patient, however the patient symptomatically improved and is appropriate for outpatient follow-up.     Final Clinical Impression(s) / ED Diagnoses Final diagnoses:  Biliary calculus of other site without obstruction  Globus sensation     @PCDICTATION @    Afton Horse T, DO 05/17/24 1817
